# Patient Record
Sex: Female | Born: 1960 | Race: White | Hispanic: No | Marital: Married | Smoking: Former smoker
Health system: Southern US, Community
[De-identification: ages and names within clinical notes are randomized; demographics above are authoritative.]

## PROBLEM LIST (undated history)

## (undated) DIAGNOSIS — J309 Allergic rhinitis, unspecified: Secondary | ICD-10-CM

## (undated) DIAGNOSIS — K219 Gastro-esophageal reflux disease without esophagitis: Secondary | ICD-10-CM

## (undated) DIAGNOSIS — Z9889 Other specified postprocedural states: Secondary | ICD-10-CM

## (undated) DIAGNOSIS — K449 Diaphragmatic hernia without obstruction or gangrene: Secondary | ICD-10-CM

## (undated) DIAGNOSIS — I10 Essential (primary) hypertension: Secondary | ICD-10-CM

## (undated) DIAGNOSIS — N979 Female infertility, unspecified: Secondary | ICD-10-CM

## (undated) DIAGNOSIS — F419 Anxiety disorder, unspecified: Secondary | ICD-10-CM

## (undated) DIAGNOSIS — F32A Depression, unspecified: Secondary | ICD-10-CM

## (undated) DIAGNOSIS — Z85828 Personal history of other malignant neoplasm of skin: Secondary | ICD-10-CM

## (undated) DIAGNOSIS — J302 Other seasonal allergic rhinitis: Secondary | ICD-10-CM

## (undated) DIAGNOSIS — M1990 Unspecified osteoarthritis, unspecified site: Secondary | ICD-10-CM

## (undated) DIAGNOSIS — Z973 Presence of spectacles and contact lenses: Secondary | ICD-10-CM

## (undated) DIAGNOSIS — I493 Ventricular premature depolarization: Secondary | ICD-10-CM

## (undated) DIAGNOSIS — L509 Urticaria, unspecified: Secondary | ICD-10-CM

## (undated) DIAGNOSIS — E785 Hyperlipidemia, unspecified: Secondary | ICD-10-CM

## (undated) DIAGNOSIS — F329 Major depressive disorder, single episode, unspecified: Secondary | ICD-10-CM

## (undated) DIAGNOSIS — G2581 Restless legs syndrome: Secondary | ICD-10-CM

## (undated) DIAGNOSIS — K5909 Other constipation: Secondary | ICD-10-CM

## (undated) DIAGNOSIS — N939 Abnormal uterine and vaginal bleeding, unspecified: Secondary | ICD-10-CM

## (undated) HISTORY — DX: Depression, unspecified: F32.A

## (undated) HISTORY — PX: LUMBAR LAMINECTOMY: SHX95

## (undated) HISTORY — DX: Major depressive disorder, single episode, unspecified: F32.9

## (undated) HISTORY — DX: Restless legs syndrome: G25.81

## (undated) HISTORY — DX: Female infertility, unspecified: N97.9

## (undated) HISTORY — DX: Urticaria, unspecified: L50.9

## (undated) HISTORY — DX: Abnormal uterine and vaginal bleeding, unspecified: N93.9

---

## 1996-08-29 HISTORY — PX: DILATION AND CURETTAGE OF UTERUS: SHX78

## 1999-01-29 HISTORY — PX: TUBAL LIGATION: SHX77

## 1999-09-29 HISTORY — PX: TUBAL LIGATION: SHX77

## 1999-12-30 HISTORY — PX: LUMBAR LAMINECTOMY: SHX95

## 2000-12-29 HISTORY — PX: ENDOMETRIAL ABLATION: SHX621

## 2010-12-29 HISTORY — PX: LAPAROSCOPIC CHOLECYSTECTOMY: SUR755

## 2011-01-01 HISTORY — PX: CHOLECYSTECTOMY: SHX55

## 2014-01-10 LAB — HM COLONOSCOPY

## 2014-12-29 HISTORY — PX: MOHS SURGERY: SUR867

## 2016-08-20 DIAGNOSIS — D249 Benign neoplasm of unspecified breast: Secondary | ICD-10-CM | POA: Insufficient documentation

## 2016-10-29 DIAGNOSIS — M19019 Primary osteoarthritis, unspecified shoulder: Secondary | ICD-10-CM | POA: Insufficient documentation

## 2017-04-27 ENCOUNTER — Emergency Department (HOSPITAL_COMMUNITY)
Admission: EM | Admit: 2017-04-27 | Discharge: 2017-04-27 | Disposition: A | Discharge status: Home / Self Care | Payer: BLUE CROSS/BLUE SHIELD | Attending: Emergency Medicine | Admitting: Emergency Medicine

## 2017-04-27 ENCOUNTER — Emergency Department (HOSPITAL_COMMUNITY): Payer: BLUE CROSS/BLUE SHIELD

## 2017-04-27 ENCOUNTER — Encounter (HOSPITAL_COMMUNITY): Payer: Self-pay | Admitting: Emergency Medicine

## 2017-04-27 DIAGNOSIS — R079 Chest pain, unspecified: Secondary | ICD-10-CM | POA: Diagnosis present

## 2017-04-27 DIAGNOSIS — I700 Atherosclerosis of aorta: Secondary | ICD-10-CM | POA: Diagnosis not present

## 2017-04-27 DIAGNOSIS — I10 Essential (primary) hypertension: Secondary | ICD-10-CM | POA: Insufficient documentation

## 2017-04-27 DIAGNOSIS — R072 Precordial pain: Secondary | ICD-10-CM

## 2017-04-27 DIAGNOSIS — E041 Nontoxic single thyroid nodule: Secondary | ICD-10-CM | POA: Diagnosis not present

## 2017-04-27 DIAGNOSIS — Z7982 Long term (current) use of aspirin: Secondary | ICD-10-CM | POA: Diagnosis not present

## 2017-04-27 DIAGNOSIS — Z79899 Other long term (current) drug therapy: Secondary | ICD-10-CM | POA: Diagnosis not present

## 2017-04-27 DIAGNOSIS — R202 Paresthesia of skin: Secondary | ICD-10-CM

## 2017-04-27 DIAGNOSIS — N840 Polyp of corpus uteri: Secondary | ICD-10-CM

## 2017-04-27 DIAGNOSIS — N63 Unspecified lump in unspecified breast: Secondary | ICD-10-CM | POA: Diagnosis not present

## 2017-04-27 DIAGNOSIS — Z87891 Personal history of nicotine dependence: Secondary | ICD-10-CM | POA: Diagnosis not present

## 2017-04-27 DIAGNOSIS — N6310 Unspecified lump in the right breast, unspecified quadrant: Secondary | ICD-10-CM

## 2017-04-27 HISTORY — DX: Essential (primary) hypertension: I10

## 2017-04-27 HISTORY — DX: Anxiety disorder, unspecified: F41.9

## 2017-04-27 LAB — CBC
HCT: 42.8 % (ref 36.0–46.0)
Hemoglobin: 14.8 g/dL (ref 12.0–15.0)
MCH: 31 pg (ref 26.0–34.0)
MCHC: 34.6 g/dL (ref 30.0–36.0)
MCV: 89.5 fL (ref 78.0–100.0)
Platelets: 224 10*3/uL (ref 150–400)
RBC: 4.78 MIL/uL (ref 3.87–5.11)
RDW: 13.1 % (ref 11.5–15.5)
WBC: 10.2 10*3/uL (ref 4.0–10.5)

## 2017-04-27 LAB — BASIC METABOLIC PANEL
Anion gap: 9 (ref 5–15)
BUN: 12 mg/dL (ref 6–20)
CHLORIDE: 104 mmol/L (ref 101–111)
CO2: 25 mmol/L (ref 22–32)
Calcium: 10.2 mg/dL (ref 8.9–10.3)
Creatinine, Ser: 0.81 mg/dL (ref 0.44–1.00)
GFR calc non Af Amer: >60 mL/min (ref >60)
Glucose, Bld: 114 mg/dL — ABNORMAL HIGH (ref 65–99)
POTASSIUM: 3.8 mmol/L (ref 3.5–5.1)
SODIUM: 138 mmol/L (ref 135–145)

## 2017-04-27 LAB — I-STAT TROPONIN, ED
Troponin i, poc: 0 ng/mL (ref 0.00–0.08)
Troponin i, poc: 0 ng/mL (ref 0.00–0.08)

## 2017-04-27 MED ORDER — IOPAMIDOL (ISOVUE-370) INJECTION 76%
INTRAVENOUS | Status: AC
  Administered 2017-04-27: 100 mL
  Filled 2017-04-27: qty 100

## 2017-04-27 NOTE — ED Provider Notes (Signed)
[facility] DEPT Provider Note   CSN: [id number] Arrival date & time: 04/27/17  0710     History   Chief Complaint Chief Complaint  Patient presents with  . Chest Pain    HPI Alice Hodges is a 56 y.o. female.  Alice Hodges is a 56 y.o. Female who presents to the ED complaining of left-sided chest pain since waking up this morning around 5:50 AM. She doesn't she woke up with her left arm feeling numb, which has since resolved. She reports this happens sometimes the pain on how she sleeps. She reports she also woke up with left-sided chest pain that radiates to her back. She reports currently her pain is a 3 out of 10. She denies associated shortness of breath. She denies associated extremity pain currently. She reports associated history of hyperlipidemia and hyper tension. She is a former smoker. She denies previous MI. She reports taking two 325 mg aspirin without relief. She denies current numbness or tingling. She denies fevers, cough, shortness of breath, palpitations, leg pain, leg swelling, recent long travel, abdominal pain, nausea, vomiting, lightheadedness, dizziness, syncope or rashes.   The history is provided by the patient, medical records and the spouse. No language interpreter was used.  Chest Pain   Associated symptoms include numbness (Resolved.). Pertinent negatives include no abdominal pain, no back pain, no cough, no dizziness, no fever, no headaches, no nausea, no palpitations, no shortness of breath, no vomiting and no weakness.    Past Medical History:  Diagnosis Date  . Anxiety   . High cholesterol   . Hypertension     There are no active problems to display for this patient.   Past Surgical History:  Procedure Laterality Date  . BACK SURGERY    . CHOLECYSTECTOMY    . TUBAL LIGATION      OB History    No data available       Home Medications    Prior to Admission medications   Medication Sig Start Date End Date Taking? Authorizing  Provider  aspirin 325 MG EC tablet Take 650 mg by mouth daily.   Yes Historical Provider, MD  calcium-vitamin D (OSCAL WITH D) 500-200 MG-UNIT tablet Take 1 tablet by mouth daily with breakfast.   Yes Historical Provider, MD  clonazePAM (KLONOPIN) 1 MG tablet Take 1 mg by mouth 2 (two) times daily as needed for anxiety. 01/29/17  Yes Historical Provider, MD  Garlic 5361 MG CAPS Take 1,000 mg by mouth daily.   Yes Historical Provider, MD  hydrochlorothiazide (HYDRODIURIL) 25 MG tablet Take 25 mg by mouth daily. 02/26/17  Yes Historical Provider, MD  ibuprofen (ADVIL,MOTRIN) 200 MG tablet Take 400 mg by mouth every 6 (six) hours as needed (pain).   Yes Historical Provider, MD  KRILL OIL PO Take 1 capsule by mouth daily.   Yes Historical Provider, MD  lisinopril (PRINIVIL,ZESTRIL) 10 MG tablet Take 10 mg by mouth daily. 04/13/17  Yes Historical Provider, MD  Multiple Vitamin (MULTIVITAMIN WITH MINERALS) TABS tablet Take 1 tablet by mouth daily.   Yes Historical Provider, MD  Multiple Vitamins-Minerals (PRESERVISION AREDS 2 PO) Take 1 capsule by mouth daily.   Yes Historical Provider, MD  Red Yeast Rice 600 MG TABS Take 600 mg by mouth daily.   Yes Historical Provider, MD    Family History No family history on file.  Social History Social History  Substance Use Topics  . Smoking status: Former Research scientist (life sciences)  . Smokeless tobacco: Never Used  .  Alcohol use Yes     Allergies   Patient has no known allergies.   Review of Systems Review of Systems  Constitutional: Negative for chills and fever.  HENT: Negative for congestion and sore throat.   Eyes: Negative for visual disturbance.  Respiratory: Negative for cough, shortness of breath and wheezing.   Cardiovascular: Positive for chest pain. Negative for palpitations and leg swelling.  Gastrointestinal: Negative for abdominal pain, diarrhea, nausea and vomiting.  Genitourinary: Negative for dysuria.  Musculoskeletal: Negative for back pain and neck  pain.  Skin: Negative for rash.  Neurological: Positive for numbness (Resolved.). Negative for dizziness, syncope, weakness, light-headedness and headaches.     Physical Exam Updated Vital Signs BP 137/72 (BP Location: Left Arm)   Pulse (!) 54   Temp 98.2 F (36.8 C) (Oral)   Resp 17   Ht 5' 5.5" (1.664 m)   Wt 94.3 kg   LMP 03/21/2017   SpO2 99%   BMI 34.09 kg/m   Physical Exam  Constitutional: She appears well-developed and well-nourished. No distress.  Nontoxic-appearing.  HENT:  Head: Normocephalic and atraumatic.  Mouth/Throat: Oropharynx is clear and moist.  Eyes: Conjunctivae are normal. Pupils are equal, round, and reactive to light. Right eye exhibits no discharge. Left eye exhibits no discharge.  Neck: Normal range of motion. Neck supple. No JVD present.  Cardiovascular: Normal rate, regular rhythm, normal heart sounds and intact distal pulses.  Exam reveals no gallop and no friction rub.   No murmur heard. Bilateral radial pulses are intact.  Pulmonary/Chest: Effort normal and breath sounds normal. No stridor. No respiratory distress. She has no wheezes. She has no rales. She exhibits no tenderness.  Lungs are clear to ascultation bilaterally. Symmetric chest expansion bilaterally. No increased work of breathing. No rales or rhonchi.    Abdominal: Soft. There is no tenderness.  Musculoskeletal: Normal range of motion. She exhibits no edema or tenderness.  No lower extremity edema or tenderness.  Lymphadenopathy:    She has no cervical adenopathy.  Neurological: She is alert. Coordination normal.  Skin: Skin is warm and dry. Capillary refill takes less than 2 seconds. No rash noted. She is not diaphoretic. No erythema. No pallor.  Psychiatric: She has a normal mood and affect. Her behavior is normal.  Nursing note and vitals reviewed.    ED Treatments / Results  Labs (all labs ordered are listed, but only abnormal results are displayed) Labs Reviewed  BASIC  METABOLIC PANEL - Abnormal; Notable for the following:       Result Value   Glucose, Bld 114 (*)    All other components within normal limits  CBC  I-STAT TROPOININ, ED  I-STAT TROPOININ, ED    EKG  EKG Interpretation  Date/Time:  Monday April 27 2017 07:20:20 EDT Ventricular Rate:  68 PR Interval:    QRS Duration: 85 QT Interval:  391 QTC Calculation: 416 R Axis:   -50 Text Interpretation:  Sinus rhythm Inferior infarct, old Anterior infarct, old Confirmed by Zenia Resides  MD, ANTHONY ([id number]) on 04/27/2017 7:32:31 AM       Radiology Dg Chest 2 View  Result Date: 04/27/2017 CLINICAL DATA:  Chest pain. EXAM: CHEST  2 VIEW COMPARISON:  No recent prior FINDINGS: The heart size and mediastinal contours are within normal limits. Both lungs are clear. The visualized skeletal structures are unremarkable. IMPRESSION: No active cardiopulmonary disease. Electronically Signed   By: [place]   On: 04/27/2017 07:39   Ct Angio Chest/abd/pel  For Dissection W And/or W/wo  Result Date: 04/27/2017 CLINICAL DATA:  56 year old presenting with acute onset of chest pain radiating to the back associated with left upper extremity numbness that began at approximately 6 o'clock a.m. this morning. EXAM: CT ANGIOGRAPHY CHEST, ABDOMEN AND PELVIS TECHNIQUE: Multidetector CT imaging was initially performed through the chest prior to IV contrast administration. Subsequently, multidetector CT imaging through the chest, abdomen and pelvis was performed using the standard protocol during bolus administration of intravenous contrast. Multiplanar reconstructed images and MIPs were obtained and reviewed to evaluate the vascular anatomy. CONTRAST:  100 mL Isovue 370 IV. COMPARISON:  No prior CT.  Chest x-ray earlier today is correlated. FINDINGS: CTA CHEST FINDINGS Cardiovascular: Unenhanced images demonstrate no evidence of mural hematoma. Enhanced images demonstrate no evidence of thoracic aortic dissection. Minimal to  mild thoracic aortic atherosclerosis is present without evidence of aneurysm. Proximal great vessels widely patent. Heart mildly enlarged with left ventricular enlargement. No pericardial effusion. No visible coronary atherosclerosis. Mediastinum/Nodes: Scattered normal sized mediastinal and bilateral hilar lymph nodes without pathologic lymphadenopathy. No pathologic axillary lymphadenopathy. Normal appearing decompressed esophagus. Small hiatal hernia. Small nodules in the right lobe of the thyroid gland, the largest approximately 9 mm. Lungs/Pleura: Scarring in the lingula. Lungs otherwise clear. No confluent airspace consolidation. No pulmonary parenchymal nodules or masses. No evidence of interstitial disease. No pleural effusions. Central airways patent without significant bronchial wall thickening. Musculoskeletal: Minimal mid and lower thoracic spondylosis. No acute or significant osseous abnormality. Other: Asymmetric fibroglandular tissue versus mass involving the upper outer quadrant of the right breast. Review of the MIP images confirms the above findings. CTA ABDOMEN AND PELVIS FINDINGS VASCULAR Aorta: Mild atherosclerosis without evidence of aneurysm or dissection. Celiac: Widely patent. SMA: Widely patent. Renals: Single right renal artery and 2 left renal arteries, all widely patent. IMA: Widely patent. Inflow: Atherosclerosis involving the common iliac arteries, external iliac arteries and internal iliac arteries without evidence of hemodynamically significant stenosis or aneurysm. Veins: Exam was performed for visualization of the arterial system. The veins are not optimally opacified, but there is no asymmetric enlargement of the femoral veins were iliac veins to suggest thrombus. The renal veins are well opacified and are widely patent. Review of the MIP images confirms the above findings. NON-VASCULAR Hepatobiliary: Simple cysts involving the left lobe of the liver. No significant focal hepatic  abnormality allowing for the early portal venous phase of enhancement. Gallbladder surgically absent. No biliary ductal dilation. Pancreas: Normal in appearance without evidence of mass, ductal dilation, or inflammation. Spleen: Heterogeneous enhancement due to the arterial/early portal venous phase of enhancement. No focal abnormalities are suspected. Adrenals/Urinary Tract: Normal appearing adrenal glands. Approximate 1.9 cm simple cyst arising from the mid right kidney. No significant abnormalities involving either kidney. No hydronephrosis. No urinary tract calculi. Urinary bladder normal in appearance. Stomach/Bowel: Small hiatal hernia as mentioned above. Stomach decompressed and otherwise unremarkable. Normal-appearing small bowel. Opaque ingested capsule involving the distal small bowel in the pelvis. Sigmoid colon tortuous. Scattered colonic diverticulum without evidence of acute diverticulitis. Ingested opaque tablets in the proximal and mid transverse colon. Moderate colonic stool burden. Normal appendix in the right upper pelvis. Lymphatic: No pathologic lymphadenopathy. Reproductive: Enhancing approximate 8 mm nodule arising from the endometrium. No myometrial masses. Approximate 2 cm cyst arising from the right ovary. Note significant adnexal masses. No free pelvic fluid. Other: None. Musculoskeletal: Degenerative disc disease and spondylosis at L4-5 and L5-S1. Circumferential disc bulge at L5-S1 with possible right paracentral and  foraminal disc extrusion at this level. Review of the MIP images confirms the above findings. IMPRESSION: 1. No evidence of aortic dissection or aneurysm. Mild atherosclerosis involving the thoracic and abdominal aorta. (Imaging finding of potential clinical significance). 2. Enhancing approximate 8 mm mass arising from the endometrium. An endometrial polyp or endometrial malignancy can have this CT appearance. Gynecologic consultation for endoscopic evaluation and possible  endometrial biopsy is recommended. 3. Asymmetric fibroglandular tissue versus mass involving the upper outer quadrant of the right breast. Please correlate with mammography. There are no prior mammograms in the [facility] for comparison. 4.  No acute cardiopulmonary disease.  Scarring in the lingula. 5. Right lobe thyroid nodules, the largest measuring approximately 9 mm. No further imaging evaluation is needed. This follows ACR consensus guidelines: Managing Incidental Thyroid Nodules Detected on Imaging: White Paper of the ACR Incidental Thyroid Findings Committee. J Am Coll Radiol 2015; 12:143-150. 6. Small hiatal hernia. 7. Scattered colonic diverticula without evidence of acute diverticulitis. 8. Possible L5-S1 right paracentral and foraminal disc extrusion. Does the patient have right lower extremity radicular symptoms? Electronically Signed   By: Evangeline Dakin M.D.   On: 04/27/2017 10:29    Procedures Procedures (including critical care time)  Medications Ordered in ED Medications  iopamidol (ISOVUE-370) 76 % injection (100 mLs  Contrast Given 04/27/17 0943)     Initial Impression / Assessment and Plan / ED Course  I have reviewed the triage vital signs and the nursing notes.  Pertinent labs & imaging results that were available during my care of the patient were reviewed by me and considered in my medical decision making (see chart for details).    This is a 56 y.o. Female who presents to the ED complaining of left-sided chest pain since waking up this morning around 5:50 AM. She doesn't she woke up with her left arm feeling numb, which has since resolved. She reports this happens sometimes the pain on how she sleeps. She reports she also woke up with left-sided chest pain that radiates to her back. She reports currently her pain is a 3 out of 10. She denies associated shortness of breath. She denies associated extremity pain currently. She reports associated history of  hyperlipidemia and hyper tension. She is a former smoker. She denies previous MI. She reports taking two 325 mg aspirin without relief. She denies current numbness or tingling. On exam the patient is afebrile nontoxic appearing. Lungs clear to auscultation bilaterally. Chest wall is nontender to palpation. EKG shows no evidence of STEMI. There is evidence of possible old inferior MI. Chest x-ray is unremarkable. Initial troponin is not elevated. BMP and CBC are unremarkable.  Due to this patient's complaint of chest pain and arm numbness will obtain CT dissection study to rule out dissection. CT dissection study shows no evidence of aortic dissection or aneurysm. It does show mild atherosclerosis involving the thoracic and abdominal aorta. I advised this finding to the patient.  Repeat troponin is not elevated. On repeat exam patient reports feeling better and has some reproducible pain on palpation of her chest wall.  I discussed the incidental findings on her CT scan including the aortic atherosclerosis. I advise of the thyroid nodule, endometrial polyp and encouraged follow-up with OB, hiatal hernia, and possible L5-S1 right paracentral and foraminal disc extrusion. She denies any current back pain or radicular symptoms.  Also discussed the fibroglandular tissue in the right breast. Patient reports she has had previous mammograms with this finding at an  outside facility. She is artery and evaluated for this previously. Overall workup is reassuring here today. I did encourage her to follow-up with cardiology for possible stress test. Also encouraged follow-up by primary care for hypertension and thyroid nodule. Also gynecology for her endometrial polyp. Discussed strict and specific return precautions. I advised the patient to follow-up with their primary care provider this week. I advised the patient to return to the emergency department with new or worsening symptoms or new concerns. The patient  verbalized understanding and agreement with plan.    This patient was discussed with and evaluated by Dr. Zenia Resides who agrees with assessment and plan.   Final Clinical Impressions(s) / ED Diagnoses   Final diagnoses:  Precordial pain  Paresthesias  Aortic atherosclerosis (HCC)  Endometrial polyp  Thyroid nodule  Mass of right breast  Essential hypertension    New Prescriptions New Prescriptions   No medications on file     Waynetta Pean, PA-C 04/27/17 1232

## 2017-04-27 NOTE — ED Provider Notes (Signed)
Medical screening examination/treatment/procedure(s) were conducted as a shared visit with non-physician practitioner(s) and myself.  I personally evaluated the patient during the encounter.   EKG Interpretation  Date/Time:  Monday April 27 2017 07:20:20 EDT Ventricular Rate:  68 PR Interval:    QRS Duration: 85 QT Interval:  391 QTC Calculation: 416 R Axis:   -50 Text Interpretation:  Sinus rhythm Inferior infarct, old Anterior infarct, old Confirmed by Gwenevere Goga  MD, Karry Causer ([id number]) on 04/27/2017 7:32:31 AM     Patient here complaining of persistent left-sided chest pain with left arm numbness times several hours. Pain is sharp and located on her left parasternal area. States that she has had left arm numbness before in the past when she has slept funny. No anginal type qualities. On exam she has has reducible tenderness at the left parasternal region. CT of the chest negative for PE. Patient to have a delta troponin and then will be discharged home with follow-up for her chest CT results.   Lacretia Leigh, MD 04/27/17 1106

## 2017-04-27 NOTE — ED Triage Notes (Signed)
Patient states that when she woke up she had left arm numbness which happens frequently but then started having chest tightness and pressure that radiates to her back.  Patient states numbness in arm has gone away but chest pressure hasnt. Patient took 2 aspirin 325mg  with no relief.

## 2017-04-27 NOTE — ED Notes (Signed)
Patient understood AVS instructions.  She has no questions.

## 2017-04-30 ENCOUNTER — Encounter: Payer: Self-pay | Admitting: Cardiology

## 2017-04-30 ENCOUNTER — Ambulatory Visit (INDEPENDENT_AMBULATORY_CARE_PROVIDER_SITE_OTHER): Payer: BLUE CROSS/BLUE SHIELD | Admitting: Cardiology

## 2017-04-30 DIAGNOSIS — R9431 Abnormal electrocardiogram [ECG] [EKG]: Secondary | ICD-10-CM

## 2017-04-30 DIAGNOSIS — R079 Chest pain, unspecified: Secondary | ICD-10-CM | POA: Diagnosis not present

## 2017-04-30 DIAGNOSIS — E785 Hyperlipidemia, unspecified: Secondary | ICD-10-CM | POA: Diagnosis not present

## 2017-04-30 DIAGNOSIS — I10 Essential (primary) hypertension: Secondary | ICD-10-CM | POA: Diagnosis not present

## 2017-04-30 LAB — COMPREHENSIVE METABOLIC PANEL
ALBUMIN: 3.9 g/dL (ref 3.6–5.1)
ALT: 24 U/L (ref 6–29)
AST: 22 U/L (ref 10–35)
Alkaline Phosphatase: 59 U/L (ref 33–130)
BILIRUBIN TOTAL: 0.5 mg/dL (ref 0.2–1.2)
BUN: 16 mg/dL (ref 7–25)
CHLORIDE: 106 mmol/L (ref 98–110)
CO2: 24 mmol/L (ref 20–31)
Calcium: 10 mg/dL (ref 8.6–10.4)
Creat: 0.84 mg/dL (ref 0.50–1.05)
Glucose, Bld: 104 mg/dL — ABNORMAL HIGH (ref 65–99)
POTASSIUM: 4.2 mmol/L (ref 3.5–5.3)
Sodium: 140 mmol/L (ref 135–146)
Total Protein: 6.5 g/dL (ref 6.1–8.1)

## 2017-04-30 LAB — LIPID PANEL
CHOL/HDL RATIO: 5.1 ratio — AB (ref <5.0)
CHOLESTEROL: 200 mg/dL — AB (ref <200)
HDL: 39 mg/dL — ABNORMAL LOW (ref >50)
LDL Cholesterol: 123 mg/dL — ABNORMAL HIGH (ref <100)
TRIGLYCERIDES: 188 mg/dL — AB (ref <150)
VLDL: 38 mg/dL — AB (ref <30)

## 2017-04-30 NOTE — Progress Notes (Addendum)
PCP: Patient, No Pcp Per; Julaine Hua, MD  - [facility] - Dr. Duane Boston is retiring  Clinic Note: Chief Complaint  Patient presents with  . Hospitalization Follow-up    ER visit  . Chest Pain    HPI: Alice Hodges is a 56 y.o. female with a PMH notable for hypertension, hyperlipidemia as well as anxiety/depression below who presents today for recent ER visit for Chest Pain. Alice Hodges is being seen today for the evaluation of chest pain at the request of Lacretia Leigh, M.D. (EDP)   Alice Hodges was last seen on 03/16/2017 by her previous PCP who indicated that Alice Hodges has recently passed away in his memory difficult thing for her to deal with. She is now moving to [place] where there are more job opportunities and she can be closer to her (Alice) Hodges.  Recent Hospitalizations: ER evaluation on 04/27/2017 - she was seen by Dr. Lacretia Leigh. She was complaining of left-sided chest pain and left arm numbness for several hours. Described pain as sharp left parasternal area. Ruled out for MI. Chest CTA negative - but did show evidence of aortic atherosclerosis.Marland Kitchen -- She woke up with this left-sided chest pain but denied any associated dyspnea. No other symptoms of palpitations dyspnea  Studies Personally Reviewed - if available, images/films reviewed: From Epic Chart or Care Everywhere   Chest CTA or April 27 2017: No aortic dissection. Minimal to mild thoracic aortic atherosclerosis. No visible coronary atherosclerosis.(Other findings included 23mm mass in the endometrium, asymmetric fibroglandular tissue in the right breast, scarring in the lingula, right lobe thyroid nodules, scattered colonic diverticula without diverticulitis)  Interval History: Alice Hodges presents today for first cardiology visit here. She seems to be quite stressed and concerned about her recent episode of chest pain that woke her up from sleep and lasted several hours. He was  having his pressure sensation like an elephant sitting on her chest. It was associated with some shortness of breath. She has been trying to walk out on the [place] but has been scared to do so a decent pace. She has walked a little bit and stopped. In doing this she has not had any further pain.  She denies a PND, orthopnea or edema just has exertional dyspnea and exercise intolerance.  She describes an episode 2004 when she had a stress test done for having some chest discomfort and dizziness. Then in 2014 she again had some dizziness and chest discomfort and felt clammy etc. He went to the ER but no further workup was done.  Occasional positional dizzy spell ~1/ 2 month.    Has had ~ PVCs noted since her 38s - only notes when very relaxed (nothing fast or irregular).   No further chest pain or shortness of breath with rest or exertion.  No PND, orthopnea - occasional mild ankle edema (better with wgt loss) No more episodes of lightheadedness, dizziness, weakness or syncope/near syncope. No TIA/amaurosis fugax symptoms. No melena, hematochezia, hematuria, or epstaxis. No claudication.  She had been gaining quite a bit overweight up until about November 2017.  At that point, she made a decision to be more attention to her weight, she started becoming more active and eating better and has lost about 20 pounds.  ROS: A comprehensive was performed. Review of Systems  Constitutional: Negative for chills, fever and malaise/fatigue (much better this year - socially moving forward).  HENT: Negative for congestion and nosebleeds.   Respiratory: Negative for cough,  shortness of breath and wheezing.   Gastrointestinal: Negative for blood in stool and melena.  Genitourinary: Negative for hematuria.  Musculoskeletal: Positive for joint pain (L hip pain - bone spur; L Knee).  Neurological: Negative for dizziness.  Endo/Heme/Allergies: Negative for environmental allergies (better in [place] than in [place]  Psychiatric/Behavioral: Positive for depression (doing better). Negative for memory loss. The patient is not nervous/anxious and does not have insomnia.   All other systems reviewed and are negative.  I have reviewed and (if needed) personally updated the patient's problem list, medications, allergies, past medical and surgical history, social and family history.   Past Medical History:  Diagnosis Date  . Anxiety   . High cholesterol   . Hypertension   . Restless leg syndrome     Past Surgical History:  Procedure Laterality Date  . BACK SURGERY  03/2000   And then again in December 2001  . CHOLECYSTECTOMY  12/2010  . DILATION AND CURETTAGE OF UTERUS  08/1996  . TUBAL LIGATION  09/1999    Current Meds  Medication Sig  . calcium-vitamin D (OSCAL WITH D) 500-200 MG-UNIT tablet Take 1 tablet by mouth daily with breakfast.  . clonazePAM (KLONOPIN) 1 MG tablet Take 1 mg by mouth 2 (two) times daily as needed for anxiety.  . Garlic 0300 MG CAPS Take 1,000 mg by mouth daily.  . hydrochlorothiazide (HYDRODIURIL) 25 MG tablet Take 25 mg by mouth daily.  Marland Kitchen ibuprofen (ADVIL,MOTRIN) 200 MG tablet Take 400 mg by mouth every 6 (six) hours as needed (pain).  Marland Kitchen KRILL OIL PO Take 1 capsule by mouth daily. 300 MG  . lisinopril (PRINIVIL,ZESTRIL) 10 MG tablet Take 10 mg by mouth daily.  . Melatonin 10 MG CAPS Take 10 mg by mouth daily as needed.  . Multiple Vitamin (MULTIVITAMIN WITH MINERALS) TABS tablet Take 1 tablet by mouth daily.  . Multiple Vitamins-Minerals (PRESERVISION AREDS 2 PO) Take 1 capsule by mouth daily.  Marland Kitchen omeprazole (PRILOSEC) 20 MG capsule Take 20 mg by mouth every other day.  . Red Yeast Rice 600 MG TABS Take 600 mg by mouth daily.    No Known Allergies  Social History   Social History  . Marital status: Married    Spouse name: N/A  . Number of children: N/A  . Years of education: N/A   Social History Main Topics  . Smoking status: Former Research scientist (life sciences)  . Smokeless  tobacco: Never Used  . Alcohol use Yes  . Drug use: Unknown  . Sexual activity: Not Asked   Other Topics Concern  . None   Social History Narrative   She and her husband Alice Hodges) recently moved from [facility] [place] for better job opportunities. 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 She is now ready to set herself back up working.      - second marriage. No children. Her current husband Alice Hodges) has children and grandchildren.Granddaughter just diagnosed with cancer   She is starting to try to go out and exercise with her husband walking on it routinely.  -  family history includes COPD in her father; Coronary artery disease in her paternal grandmother; Hypertension in her father; Lung cancer in her paternal uncle; Polycythemia in her sister; Rheumatic fever (age of onset: 19) in her Hodges.  Wt Readings from Last 3 Encounters:  04/30/17 210 lb (95.3 kg)  04/27/17 208 lb (94.3 kg)    PHYSICAL EXAM BP 121/79   Pulse 61   Ht 5' 5.5" (1.664 m)   Wt 210 lb (95.3 kg)   BMI 34.41 kg/m  General appearance: alert, cooperative, appears stated age, no distress and Mildly obese HEENT: [place]/AT, EOMI, MMM, anicteric sclera Neck: no adenopathy, no carotid bruit and no JVD Lungs: clear to auscultation bilaterally, normal percussion bilaterally and non-labored Heart: regular rate and rhythm, S1 & S2 normal, no murmur, click, rub or gallop; nondisplaced PMI Abdomen: soft, non-tender; bowel sounds normal; no masses,  no organomegaly; no HJR Extremities: extremities normal, atraumatic, no cyanosis. edema -trivial Pulses: 2+ and symmetric;  Skin: mobility and turgor normal, no edema, no evidence of bleeding or bruising, temperature normal and texture normal  Neurologic: Mental status: A&O x3; thought content appropriate; pleasant mood and affect Cranial nerves:  normal (II-XII grossly intact)    Adult ECG Report -- from ER EKG  Rate: 68 ;  Rhythm: normal sinus rhythm and Poor R-wave progression in precordial leads suggest possible anterior infarct, age indeterminant; possible inferior Q waves suggestive of inferior infarct, age undetermined. Otherwise normal axis, intervals and durations.;   Narrative Interpretation: 2 sequential EKGs with this result. No prior EKG compared to   Other studies Reviewed: Additional studies/ records that were reviewed today include:  Recent Labs:  Labs most recently from July 2017 showed total cholesterol 204, HDL 38, LDL 101, triglycerides 323. TSH 0.7. BUN 16, creatinine 1.0. Glucose 91. LFTs normal. -- From 03/16/2017: Sodium 142, potassium 4.3, chloride 98, bicarbonate 21, BUN 15, creatinine 0.79, glucose 74, calcium 10.4. ALT/AST 30/23. Alkaline phosphatase 75. Albumin 4.5. Total bilirubin 0.4. CBC: W 12.5, H/H 15.6/46.9, platelet 287  ASSESSMENT / PLAN: Problem List Items Addressed This Visit    Abnormal finding on EKG (Chronic)    Her EKG is very interesting with looks like both inferior and anterior septal Q waves. There may be subtle R waves in all these leads, but is a very imaging EKG I would like to exclude any prior infarct. Plan: 2-D echocardiogram      Relevant Orders   ECHOCARDIOGRAM COMPLETE   ECHOCARDIOGRAM STRESS TEST   Chest pain with moderate risk for cardiac etiology    The episode she had that made her go to the emergency room was concerning for possible anginal symptoms based on the description. There were some typical and other atypical features. She does have risk factors of obesity, hypertension and dyslipidemia. As much for her peace of mind sake with her trying to exercise, I would like to exclude ischemic chest pain. At least moderate risk based on risk factors. Plan: Stress Echocardiogram      Relevant Orders   ECHOCARDIOGRAM COMPLETE   ECHOCARDIOGRAM STRESS TEST   Comprehensive  metabolic panel (Completed)   Dyslipidemia (Chronic)    Concern for possible CAD, we need to understand her risk factors.  Plan: Check CMP and lipid panel.      Relevant Orders   Lipid panel (Completed)   Comprehensive metabolic panel (Completed)   Essential hypertension (Chronic)    Her blood pressures pretty good today on combination of HCTZ and lisinopril. If she has more palpitations, we may need to consider beta blocker, however for now I would simply maintain current management.      Relevant Orders   Comprehensive metabolic panel (Completed)      Current medicines are reviewed at length with the patient  today. (+/- concerns) None The following changes have been made: None  Patient Instructions  Schedule at [facility]  [address] Do first  Your physician has requested that you have an echocardiogram. Echocardiography is a painless test that uses sound waves to create images of your heart. It provides your doctor with information about the size and shape of your heart and how well your heart's chambers and valves are working. This procedure takes approximately one hour. There are no restrictions for this procedure. Do second Your physician has requested that you have a stress echocardiogram. For further information please visit [email or web address]. Please follow instruction sheet as given.   Labs  CMP Lipids Do not eat or drink the morning of the test   Your physician recommends that you schedule a follow-up appointment in 1 months with DR HARDING.    Studies Ordered:   Orders Placed This Encounter  Procedures  . Lipid panel  . Comprehensive metabolic panel  . ECHOCARDIOGRAM COMPLETE  . ECHOCARDIOGRAM STRESS TEST      Glenetta Hew, M.D., M.S. Interventional Cardiologist   Pager # [phone] Phone # [phone] [address] [place], [place] [address]

## 2017-04-30 NOTE — Patient Instructions (Addendum)
Schedule at [facility]  [address] Do first  Your physician has requested that you have an echocardiogram. Echocardiography is a painless test that uses sound waves to create images of your heart. It provides your doctor with information about the size and shape of your heart and how well your heart's chambers and valves are working. This procedure takes approximately one hour. There are no restrictions for this procedure. Do second Your physician has requested that you have a stress echocardiogram. For further information please visit [email or web address]. Please follow instruction sheet as given.   Labs  CMP Lipids Do not eat or drink the morning of the test   Your physician recommends that you schedule a follow-up appointment in 1 months with DR HARDING.

## 2017-05-02 ENCOUNTER — Encounter: Payer: Self-pay | Admitting: Cardiology

## 2017-05-02 NOTE — Assessment & Plan Note (Addendum)
Her blood pressures pretty good today on combination of HCTZ and lisinopril. If she has more palpitations, we may need to consider beta blocker, however for now I would simply maintain current management.

## 2017-05-02 NOTE — Assessment & Plan Note (Signed)
Her EKG is very interesting with looks like both inferior and anterior septal Q waves. There may be subtle R waves in all these leads, but is a very imaging EKG I would like to exclude any prior infarct. Plan: 2-D echocardiogram

## 2017-05-02 NOTE — Assessment & Plan Note (Signed)
Concern for possible CAD, we need to understand her risk factors.  Plan: Check CMP and lipid panel.

## 2017-05-02 NOTE — Assessment & Plan Note (Signed)
The episode she had that made her go to the emergency room was concerning for possible anginal symptoms based on the description. There were some typical and other atypical features. She does have risk factors of obesity, hypertension and dyslipidemia. As much for her peace of mind sake with her trying to exercise, I would like to exclude ischemic chest pain. At least moderate risk based on risk factors. Plan: Stress Echocardiogram

## 2017-05-27 ENCOUNTER — Other Ambulatory Visit (HOSPITAL_COMMUNITY): Payer: BLUE CROSS/BLUE SHIELD

## 2017-06-05 ENCOUNTER — Ambulatory Visit: Payer: BLUE CROSS/BLUE SHIELD | Admitting: Cardiology

## 2017-06-05 ENCOUNTER — Encounter: Payer: Self-pay | Admitting: Cardiology

## 2017-07-28 LAB — TSH: TSH: 0.94 (ref 0.41–5.90)

## 2017-07-28 LAB — HM PAP SMEAR: HM Pap smear: NEGATIVE

## 2017-08-27 LAB — HM MAMMOGRAPHY

## 2017-11-06 ENCOUNTER — Encounter: Payer: Self-pay | Admitting: Family Medicine

## 2017-11-06 ENCOUNTER — Ambulatory Visit: Payer: BLUE CROSS/BLUE SHIELD | Admitting: Family Medicine

## 2017-11-06 VITALS — BP 110/84 | HR 72 | Ht 64.5 in | Wt 200.0 lb

## 2017-11-06 DIAGNOSIS — E669 Obesity, unspecified: Secondary | ICD-10-CM | POA: Diagnosis not present

## 2017-11-06 DIAGNOSIS — Z1159 Encounter for screening for other viral diseases: Secondary | ICD-10-CM

## 2017-11-06 DIAGNOSIS — Z23 Encounter for immunization: Secondary | ICD-10-CM | POA: Diagnosis not present

## 2017-11-06 DIAGNOSIS — Z0001 Encounter for general adult medical examination with abnormal findings: Secondary | ICD-10-CM | POA: Diagnosis not present

## 2017-11-06 DIAGNOSIS — G2581 Restless legs syndrome: Secondary | ICD-10-CM | POA: Insufficient documentation

## 2017-11-06 DIAGNOSIS — R739 Hyperglycemia, unspecified: Secondary | ICD-10-CM | POA: Diagnosis not present

## 2017-11-06 NOTE — Progress Notes (Signed)
Subjective:  Devika Dragovich is a 56 y.o. female who presents today for her annual comprehensive physical exam And to establish care  HPI:  Eternity Dexter has no acute complaints today.   Lifestyle Diet: Tries to eat a healthy diet.  Plenty of lean meats.  Plenty of fruits and vegetables.  She has been actively trying to lose weight and has noticed that she has lost more weight since starting hormone replacement. Exercise: Regularly exercises. Walks regularly.   Depression screen PHQ 2/9 11/06/2017  Decreased Interest 0  Down, Depressed, Hopeless 0  PHQ - 2 Score 0   Health Maintenance Due  Topic Date Due  . Hepatitis C Screening  11-May-1961  . HIV Screening  04/06/1976  . INFLUENZA VACCINE  07/29/2017     ROS: A 14 point review of systems was performed and was negative  PMH:  The following were reviewed and entered/updated in epic: Past Medical History:  Diagnosis Date  . Anxiety   . High cholesterol   . Hypertension   . Restless leg syndrome    Patient Active Problem List   Diagnosis Date Noted  . Restless leg syndrome 11/06/2017  . Chest pain with moderate risk for cardiac etiology 04/30/2017  . Abnormal finding on EKG 04/30/2017  . Dyslipidemia 04/30/2017  . Essential hypertension 04/30/2017   Past Surgical History:  Procedure Laterality Date  . BACK SURGERY  03/2000   And then again in December 2001  . BREAST BIOPSY N/A   . CHOLECYSTECTOMY  12/2010  . DILATION AND CURETTAGE OF UTERUS  08/1996  . TUBAL LIGATION  09/1999    Family History  Problem Relation Age of Onset  . Rheumatic fever Mother 27       Rheumatic heart disease - she died in childbirth;  . Early death Mother   . Hearing loss Mother   . Heart disease Mother   . Heart attack Mother   . Hypertension Father   . COPD Father        Also paternal uncle  . Alcohol abuse Father   . Kidney disease Father   . Polycythemia Sister   . Depression Sister   . Coronary artery disease Paternal  Grandmother   . Heart attack Paternal Grandmother   . Heart disease Paternal Grandmother   . Depression Sister   . Lung cancer Paternal Uncle        As well as 3 maternal aunts  . Alcohol abuse Maternal Grandfather   . COPD Maternal Grandfather   . Diabetes Maternal Grandfather   . Stroke Maternal Grandfather   . Alcohol abuse Paternal Grandfather   . Mental illness Paternal Grandfather     Medications- reviewed and updated Current Outpatient Medications  Medication Sig Dispense Refill  . clonazePAM (KLONOPIN) 1 MG tablet Take 1 mg by mouth 2 (two) times daily as needed for anxiety.    . clonazePAM (KLONOPIN) 1 MG tablet TAKE 1 TABLET TWICE DAILY AS NEEDED FOR ANXIETY.    Marland Kitchen estradiol (ESTRACE) 1 MG tablet Take by mouth.    . Garlic 7829 MG CAPS Take 1,000 mg by mouth daily.    . hydrochlorothiazide (HYDRODIURIL) 25 MG tablet TAKE 1 TABLET BY MOUTH EVERY DAY **PT MUST MAKE APPT**    . ibuprofen (ADVIL,MOTRIN) 200 MG tablet Take 400 mg by mouth every 6 (six) hours as needed (pain).    Marland Kitchen KRILL OIL PO Take 1 capsule by mouth daily. 300 MG    . lisinopril (PRINIVIL,ZESTRIL) 10  MG tablet Take 10 mg by mouth daily.    Marland Kitchen loratadine (CLARITIN) 10 MG tablet Take 10 mg daily by mouth.    . medroxyPROGESTERone (PROVERA) 2.5 MG tablet     . Melatonin 10 MG CAPS Take 10 mg by mouth daily as needed.    . Multiple Vitamin (MULTIVITAMIN WITH MINERALS) TABS tablet Take 1 tablet by mouth daily.    . Multiple Vitamins-Minerals (PRESERVISION AREDS 2 PO) Take 1 capsule by mouth daily.    Marland Kitchen omeprazole (PRILOSEC) 20 MG capsule Take 20 mg by mouth every other day.    . Red Yeast Rice 600 MG TABS Take 600 mg by mouth daily.     No current facility-administered medications for this visit.     Allergies-reviewed and updated Allergies  Allergen Reactions  . Tape Rash    Social History   Socioeconomic History  . Marital status: Married    Spouse name: None  . Number of children: None  . Years of  education: None  . Highest education level: Bachelor's degree (e.g., BA, AB, BS)  Social Needs  . Financial resource strain: None  . Food insecurity - worry: None  . Food insecurity - inability: None  . Transportation needs - medical: None  . Transportation needs - non-medical: None  Occupational History  . Occupation: Self Employed  Tobacco Use  . Smoking status: Former Research scientist (life sciences)  . Smokeless tobacco: Never Used  Substance and Sexual Activity  . Alcohol use: No    Frequency: Never  . Drug use: No  . Sexual activity: Yes  Other Topics Concern  . None  Social History Narrative   She and her husband Clair Gulling) recently moved from [facility] down to [place] for better job opportunities. 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 She is now ready to set herself back up working.      - second marriage. No children. Her current husband Clair Gulling) has children and grandchildren.Granddaughter just diagnosed with cancer   She is starting to try to go out and exercise with her husband walking on it routinely.    Objective:  Physical Exam: BP 110/84   Pulse 72   Ht 5' 4.5" (1.638 m)   Wt 200 lb (90.7 kg)   LMP 10/17/2017 (Exact Date)   SpO2 98%   BMI 33.80 kg/m   Body mass index is 33.8 kg/m. Gen: NAD, resting comfortably HEENT: TMs normal bilaterally. OP clear. No thyromegaly noted.  CV: RRR with no murmurs appreciated Pulm: NWOB, CTAB with no crackles, wheezes, or rhonchi GI: Normal bowel sounds present. Soft, Nontender, Nondistended. MSK: no edema, cyanosis, or clubbing noted Skin: warm, dry Neuro: CN2-12 grossly intact. Strength 5/5 in upper and lower extremities. Reflexes symmetric and intact bilaterally.  Psych: Normal affect and thought content  Assessment/Plan:  Preventative Healthcare: Flu shot given today.  Check lipid panel, A1c, and basic labs.   Patient is up-to-date on her mammogram, Pap smear, and colonoscopy.  Referral to nutrition for weight management.  Obesity: Discussed lifestyle interventions today.  Patient is actively trying to lose weight.  She is not interested in pharmacotherapy at this point.  She has been on phentermine in the past which she was not able to tolerate due to palpitations.  Referral to nutrition was placed today.  Patient Counseling:  -Nutrition: Stressed importance of moderation in sodium/caffeine  intake, saturated fat and cholesterol, caloric balance, sufficient intake of fresh fruits, vegetables, and fiber.  -Stressed the importance of regular exercise.   -Substance Abuse: Discussed cessation/primary prevention of tobacco, alcohol, or other drug use; driving or other dangerous activities under the influence; availability of treatment for abuse.   -Injury prevention: Discussed safety belts, safety helmets, smoke detector, smoking near bedding or upholstery.   -Sexuality: Discussed sexually transmitted diseases, partner selection, use of condoms, avoidance of unintended pregnancy and contraceptive alternatives.   -Dental health: Discussed importance of regular tooth brushing, flossing, and dental visits.  -Health maintenance and immunizations reviewed. Please refer to Health maintenance section.  Return to care in 1 year for next preventative visit.   Algis Greenhouse. Jerline Pain, MD 11/06/2017 5:17 PM

## 2017-11-06 NOTE — Patient Instructions (Signed)
Please schedule an appointment with Aldona Bar.  I will plan on seeing you in 1 year.  Take care,  Dr Jerline Pain   Preventive Care 40-64 Years, Female Preventive care refers to lifestyle choices and visits with your health care provider that can promote health and wellness. What does preventive care include?  A yearly physical exam. This is also called an annual well check.  Dental exams once or twice a year.  Routine eye exams. Ask your health care provider how often you should have your eyes checked.  Personal lifestyle choices, including: ? Daily care of your teeth and gums. ? Regular physical activity. ? Eating a healthy diet. ? Avoiding tobacco and drug use. ? Limiting alcohol use. ? Practicing safe sex. ? Taking low-dose aspirin daily starting at age 32. ? Taking vitamin and mineral supplements as recommended by your health care provider. What happens during an annual well check? The services and screenings done by your health care provider during your annual well check will depend on your age, overall health, lifestyle risk factors, and family history of disease. Counseling Your health care provider may ask you questions about your:  Alcohol use.  Tobacco use.  Drug use.  Emotional well-being.  Home and relationship well-being.  Sexual activity.  Eating habits.  Work and work Statistician.  Method of birth control.  Menstrual cycle.  Pregnancy history.  Screening You may have the following tests or measurements:  Height, weight, and BMI.  Blood pressure.  Lipid and cholesterol levels. These may be checked every 5 years, or more frequently if you are over 51 years old.  Skin check.  Lung cancer screening. You may have this screening every year starting at age 59 if you have a 30-pack-year history of smoking and currently smoke or have quit within the past 15 years.  Fecal occult blood test (FOBT) of the stool. You may have this test every year  starting at age 38.  Flexible sigmoidoscopy or colonoscopy. You may have a sigmoidoscopy every 5 years or a colonoscopy every 10 years starting at age 36.  Hepatitis C blood test.  Hepatitis B blood test.  Sexually transmitted disease (STD) testing.  Diabetes screening. This is done by checking your blood sugar (glucose) after you have not eaten for a while (fasting). You may have this done every 1-3 years.  Mammogram. This may be done every 1-2 years. Talk to your health care provider about when you should start having regular mammograms. This may depend on whether you have a family history of breast cancer.  BRCA-related cancer screening. This may be done if you have a family history of breast, ovarian, tubal, or peritoneal cancers.  Pelvic exam and Pap test. This may be done every 3 years starting at age 83. Starting at age 36, this may be done every 5 years if you have a Pap test in combination with an HPV test.  Bone density scan. This is done to screen for osteoporosis. You may have this scan if you are at high risk for osteoporosis.  Discuss your test results, treatment options, and if necessary, the need for more tests with your health care provider. Vaccines Your health care provider may recommend certain vaccines, such as:  Influenza vaccine. This is recommended every year.  Tetanus, diphtheria, and acellular pertussis (Tdap, Td) vaccine. You may need a Td booster every 10 years.  Varicella vaccine. You may need this if you have not been vaccinated.  Zoster vaccine. You may need this  after age 76.  Measles, mumps, and rubella (MMR) vaccine. You may need at least one dose of MMR if you were born in 1957 or later. You may also need a second dose.  Pneumococcal 13-valent conjugate (PCV13) vaccine. You may need this if you have certain conditions and were not previously vaccinated.  Pneumococcal polysaccharide (PPSV23) vaccine. You may need one or two doses if you smoke  cigarettes or if you have certain conditions.  Meningococcal vaccine. You may need this if you have certain conditions.  Hepatitis A vaccine. You may need this if you have certain conditions or if you travel or work in places where you may be exposed to hepatitis A.  Hepatitis B vaccine. You may need this if you have certain conditions or if you travel or work in places where you may be exposed to hepatitis B.  Haemophilus influenzae type b (Hib) vaccine. You may need this if you have certain conditions.  Talk to your health care provider about which screenings and vaccines you need and how often you need them. This information is not intended to replace advice given to you by your health care provider. Make sure you discuss any questions you have with your health care provider. Document Released: 01/11/2016 Document Revised: 09/03/2016 Document Reviewed: 10/16/2015 Elsevier Interactive Patient Education  2017 [employer].

## 2017-11-07 LAB — LIPID PANEL
CHOLESTEROL: 190 mg/dL (ref <200)
HDL: 46 mg/dL — ABNORMAL LOW (ref >50)
LDL CHOLESTEROL (CALC): 117 mg/dL — AB
Non-HDL Cholesterol (Calc): 144 mg/dL (calc) — ABNORMAL HIGH (ref <130)
TRIGLYCERIDES: 156 mg/dL — AB (ref <150)
Total CHOL/HDL Ratio: 4.1 (calc) (ref <5.0)

## 2017-11-07 LAB — CBC
HCT: 44.7 % (ref 35.0–45.0)
Hemoglobin: 14.8 g/dL (ref 11.7–15.5)
MCH: 29.1 pg (ref 27.0–33.0)
MCHC: 33.1 g/dL (ref 32.0–36.0)
MCV: 88 fL (ref 80.0–100.0)
MPV: 11.9 fL (ref 7.5–12.5)
PLATELETS: 289 10*3/uL (ref 140–400)
RBC: 5.08 10*6/uL (ref 3.80–5.10)
RDW: 12.6 % (ref 11.0–15.0)
WBC: 8.9 10*3/uL (ref 3.8–10.8)

## 2017-11-07 LAB — HEMOGLOBIN A1C
HEMOGLOBIN A1C: 5.4 %{Hb} (ref <5.7)
MEAN PLASMA GLUCOSE: 108 (calc)
eAG (mmol/L): 6 (calc)

## 2017-11-07 LAB — COMPREHENSIVE METABOLIC PANEL
AG RATIO: 1.5 (calc) (ref 1.0–2.5)
ALT: 21 U/L (ref 6–29)
AST: 23 U/L (ref 10–35)
Albumin: 4 g/dL (ref 3.6–5.1)
Alkaline phosphatase (APISO): 63 U/L (ref 33–130)
BUN: 13 mg/dL (ref 7–25)
CALCIUM: 9.5 mg/dL (ref 8.6–10.4)
CO2: 24 mmol/L (ref 20–32)
Chloride: 104 mmol/L (ref 98–110)
Creat: 0.83 mg/dL (ref 0.50–1.05)
GLUCOSE: 86 mg/dL (ref 65–99)
Globulin: 2.6 g/dL (calc) (ref 1.9–3.7)
Potassium: 3.8 mmol/L (ref 3.5–5.3)
Sodium: 139 mmol/L (ref 135–146)
Total Bilirubin: 0.4 mg/dL (ref 0.2–1.2)
Total Protein: 6.6 g/dL (ref 6.1–8.1)

## 2017-11-07 LAB — HEPATITIS C ANTIBODY
Hepatitis C Ab: NONREACTIVE
SIGNAL TO CUT-OFF: 0.01 (ref <1.00)

## 2017-11-27 ENCOUNTER — Encounter: Payer: Self-pay | Admitting: Family Medicine

## 2017-12-03 ENCOUNTER — Encounter: Payer: Self-pay | Admitting: Family Medicine

## 2017-12-03 ENCOUNTER — Encounter: Payer: Self-pay | Admitting: Surgical

## 2017-12-03 DIAGNOSIS — L989 Disorder of the skin and subcutaneous tissue, unspecified: Secondary | ICD-10-CM | POA: Insufficient documentation

## 2017-12-09 ENCOUNTER — Encounter: Payer: Self-pay | Admitting: Family Medicine

## 2017-12-10 ENCOUNTER — Other Ambulatory Visit: Payer: Self-pay | Admitting: Family Medicine

## 2017-12-11 NOTE — Telephone Encounter (Signed)
Ok to refill. She will need to schedule an appointment with me in the next few months to discuss since it is a controlled medication.  Algis Greenhouse. Jerline Pain, MD 12/11/2017 2:08 PM

## 2018-01-15 ENCOUNTER — Other Ambulatory Visit: Payer: Self-pay

## 2018-01-15 ENCOUNTER — Telehealth: Payer: Self-pay | Admitting: Family Medicine

## 2018-01-15 DIAGNOSIS — Z7689 Persons encountering health services in other specified circumstances: Secondary | ICD-10-CM

## 2018-01-15 NOTE — Telephone Encounter (Signed)
Referral has been placed. 

## 2018-01-15 NOTE — Telephone Encounter (Signed)
Copied from [place] #[id number]. Topic: Quick Communication - See Telephone Encounter >> Jan 15, 2018 10:35 AM Clack, Laban Emperor wrote: CRM for notification. See Telephone encounter for: Pt states that her OBGYN is retiring on 03/28/18 and would like to know if Dr. Jerline Pain would recommend her to some gynecologist in the [place] area.  Please f/u with pt.  01/15/18.

## 2018-01-15 NOTE — Telephone Encounter (Signed)
Ok to place referral.

## 2018-01-15 NOTE — Telephone Encounter (Signed)
LM on patient's VM letting her know referral has been placed.

## 2018-01-15 NOTE — Telephone Encounter (Signed)
Ok with me. Please place any necessary orders. 

## 2018-01-18 ENCOUNTER — Telehealth: Payer: Self-pay | Admitting: Obstetrics and Gynecology

## 2018-01-18 NOTE — Telephone Encounter (Signed)
Called and left a message for patient to call back to schedule a new patient doctor referral appointment with our office for an AEX.

## 2018-01-30 ENCOUNTER — Other Ambulatory Visit: Payer: Self-pay | Admitting: Family Medicine

## 2018-02-01 NOTE — Telephone Encounter (Signed)
Rx sent in. Please ask patient to schedule appointment for future refills.  Algis Greenhouse. Jerline Pain, MD 02/01/2018 8:38 AM

## 2018-02-01 NOTE — Telephone Encounter (Signed)
Please advise 

## 2018-02-15 ENCOUNTER — Ambulatory Visit (INDEPENDENT_AMBULATORY_CARE_PROVIDER_SITE_OTHER): Payer: BLUE CROSS/BLUE SHIELD | Admitting: Family Medicine

## 2018-02-15 ENCOUNTER — Encounter: Payer: Self-pay | Admitting: Family Medicine

## 2018-02-15 DIAGNOSIS — F419 Anxiety disorder, unspecified: Secondary | ICD-10-CM | POA: Diagnosis not present

## 2018-02-15 DIAGNOSIS — I10 Essential (primary) hypertension: Secondary | ICD-10-CM | POA: Diagnosis not present

## 2018-02-15 MED ORDER — CLONAZEPAM 1 MG PO TABS: 1.0000 mg | ORAL_TABLET | Freq: Three times a day (TID) | ORAL | 5 refills | Status: DC | PRN

## 2018-02-15 NOTE — Assessment & Plan Note (Addendum)
Stable.  Klonopin refilled.  Database reviewed without red flags.  Discussed risk of chronic benzo use including increased risk of increased sedation, falls, and memory loss.  Also discussed risk of respiratory depression.  Follow-up in 6 months, or sooner as needed.  Consider addition of SSRI if needs escalating dose of benzo.

## 2018-02-15 NOTE — Progress Notes (Signed)
   Subjective:  Alice Hodges is a 57 y.o. female who presents today with a chief complaint of anxiety.   HPI:  Anxiety, Established Problem Patient currently on Klonopin 1 mg twice daily as needed.  She has done well with this dose.  No obvious side effects or adverse reactions.  No dizziness, excessive sedation, forgetfulness, or falls.  She has noted increased life stress recently.  She recently started back school and is studying computer program.  This is caused her a significant amount of increased stress.  Also recently found out that her 28-month-old grandchild will be requiring surgery for cancer.  There has been a difficult time for them, however she thinks that she is handling it well.  No SI or HI.  Hypertension, established problem, stable Currently on lisinopril 10 mg daily and HCTZ 25 mg daily.  Tolerates both these doses well without any side effects.  No chest pain or shortness of breath.  ROS: Per HPI  Objective:  Physical Exam: BP 118/80   Pulse 70   Temp 98.6 F (37 C) (Oral)   Wt 190 lb 12.8 oz (86.5 kg)   LMP 02/15/2018   SpO2 94%   BMI 32.24 kg/m   Gen: NAD, resting comfortably CV: RRR with no murmurs appreciated Pulm: NWOB, CTAB with no crackles, wheezes, or rhonchi Psych: Intermittently tearful.  No SI or HI.  Normal thought content.  No apparent AVH.  Assessment/Plan:  Anxiety Stable.  Klonopin refilled.  Database reviewed without red flags.  Discussed risk of chronic benzo use including increased risk of increased sedation, falls, and memory loss.  Also discussed risk of respiratory depression.  Follow-up in 6 months, or sooner as needed.  Consider addition of SSRI if needs escalating dose of benzo.  Essential hypertension At goal.  Continue lisinopril and HCTZ.  Algis Greenhouse. Jerline Pain, MD 02/15/2018 3:18 PM

## 2018-02-15 NOTE — Assessment & Plan Note (Signed)
At goal. Continue lisinopril and HCTZ. 

## 2018-03-03 ENCOUNTER — Telehealth: Payer: Self-pay | Admitting: Obstetrics and Gynecology

## 2018-03-03 ENCOUNTER — Encounter: Payer: Self-pay | Admitting: Obstetrics and Gynecology

## 2018-03-03 ENCOUNTER — Ambulatory Visit (INDEPENDENT_AMBULATORY_CARE_PROVIDER_SITE_OTHER): Payer: BLUE CROSS/BLUE SHIELD | Admitting: Obstetrics and Gynecology

## 2018-03-03 ENCOUNTER — Other Ambulatory Visit: Payer: Self-pay

## 2018-03-03 VITALS — BP 110/70 | HR 64 | Resp 16 | Ht 64.5 in | Wt 187.0 lb

## 2018-03-03 DIAGNOSIS — Z9889 Other specified postprocedural states: Secondary | ICD-10-CM | POA: Diagnosis not present

## 2018-03-03 DIAGNOSIS — Z79890 Hormone replacement therapy (postmenopausal): Secondary | ICD-10-CM

## 2018-03-03 DIAGNOSIS — N939 Abnormal uterine and vaginal bleeding, unspecified: Secondary | ICD-10-CM | POA: Diagnosis not present

## 2018-03-03 DIAGNOSIS — N6310 Unspecified lump in the right breast, unspecified quadrant: Secondary | ICD-10-CM

## 2018-03-03 NOTE — Progress Notes (Signed)
57 y.o. [id number] MarriedCaucasianF here for c/o irregular menstrual cycles.  Normal CBC and TSH in 11/8.  She has had an evaluation by her prior GYN who is retiring. She has a h/o an endometrial ablation in 2002. Last summer she had an endometrial biopsy. Her MD started her on HRT in th fall because of her abnormal bleeding, not because of vasomotor symptoms. No help with cyclic progesterone.   She is bleeding about every 2 weeks and can bleed for 5-9 days.  Over the last 1-2 weeks her bleeding has stopped, but she has brown d/c. Only mild cramping for a day with the start of bleeding. No dyspareunia.   H/O ectopic x 2, methotrexate x 2, needed surgery with the second one. She had a tubal ligation at the time of treatment of her last ectopic. Thinks she had some endometriosis at that time.     Period Duration (Days): 5-9 days  Period Pattern: (!) Irregular Menstrual Flow: Heavy Menstrual Control: Tampon Menstrual Control Change Freq (Hours): changes tampon every 3 hours  Dysmenorrhea: None  Patient's last menstrual period was 02/15/2018.          Sexually active: Yes.    The current method of family planning is tubal ligation.    Exercising: Yes.    walking  Smoker:  Former smoker   Health Maintenance: Pap:  07-21-16 WNL, patient reports normal pap in 8/18. History of abnormal Pap:  no MMG:  08-27-17 WNL Colonoscopy:  01-10-14 WNL BMD:   02-11-12 WNL  TDaP:  04-26-15 Gardasil: no    reports that she has quit smoking. Her smoking use included cigarettes. She has a 6.00 pack-year smoking history. she has never used smokeless tobacco. She reports that she drinks alcohol. She reports that she does not use drugs. Rare ETOH. She has 2 step sons. 2 grand daughters, they both have cancer, they have a hereditary condition (they inherited it from their mothers side). Dicer 1 syndrome, very rare. They are almost 2 and almost 4. She is going to school to become a para-legal.    Past Medical History:   Diagnosis Date  . Abnormal uterine bleeding   . Anxiety   . Cancer ([place])    skin   . Depression   . Dysmenorrhea   . High cholesterol   . Hormone disorder   . Hypertension   . Infertility, female   . Restless leg syndrome   Depression is good, still issues with anxiety. She is seeing her primary.  Working on diet and exercise to help her HTN and lipids. Exercising 5 days a week.   Past Surgical History:  Procedure Laterality Date  . ABDOMINAL SURGERY    . BACK SURGERY  03/2000   And then again in December 2001  . BREAST BIOPSY N/A   . CHOLECYSTECTOMY  12/2010  . DILATION AND CURETTAGE OF UTERUS  08/1996  . ENDOMETRIAL ABLATION    . GYNECOLOGIC CRYOSURGERY    . MOHS SURGERY  2016   left side of nose   . TUBAL LIGATION  09/1999    Current Outpatient Medications  Medication Sig Dispense Refill  . aspirin EC 81 MG tablet Take 81 mg by mouth daily.    . clonazePAM (KLONOPIN) 1 MG tablet Take 1 tablet (1 mg total) by mouth 3 (three) times daily as needed for anxiety. 90 tablet 5  . estradiol (ESTRACE) 1 MG tablet Take by mouth.    . Garlic 1308 MG CAPS Take 1,000 mg  by mouth daily.    . hydrochlorothiazide (HYDRODIURIL) 25 MG tablet TAKE 1 TABLET BY MOUTH EVERY DAY **PT MUST MAKE APPT**    . ibuprofen (ADVIL,MOTRIN) 200 MG tablet Take 400 mg by mouth every 6 (six) hours as needed (pain).    Marland Kitchen KRILL OIL PO Take 1 capsule by mouth daily. 300 MG    . lisinopril (PRINIVIL,ZESTRIL) 10 MG tablet Take 10 mg by mouth daily.    Marland Kitchen loratadine (CLARITIN) 10 MG tablet Take 10 mg daily by mouth.    . medroxyPROGESTERone (PROVERA) 2.5 MG tablet     . Melatonin 10 MG CAPS Take 10 mg by mouth daily as needed.    . Multiple Vitamin (MULTIVITAMIN WITH MINERALS) TABS tablet Take 1 tablet by mouth daily.    . Multiple Vitamins-Minerals (PRESERVISION AREDS 2 PO) Take 1 capsule by mouth daily.    Marland Kitchen omeprazole (PRILOSEC) 20 MG capsule Take 20 mg by mouth every other day.    . Red Yeast Rice 600 MG  TABS Take 600 mg by mouth daily.     No current facility-administered medications for this visit.     Family History  Problem Relation Age of Onset  . Rheumatic fever Mother 34       Rheumatic heart disease - she died in childbirth;  . Early death Mother   . Hearing loss Mother   . Heart disease Mother   . Heart attack Mother   . Hypertension Father   . COPD Father        Also paternal uncle  . Alcohol abuse Father   . Kidney disease Father   . Polycythemia Sister   . Depression Sister   . Coronary artery disease Paternal Grandmother   . Heart attack Paternal Grandmother   . Heart disease Paternal Grandmother   . Depression Sister   . Lung cancer Paternal Uncle        As well as 3 maternal aunts  . Alcohol abuse Maternal Grandfather   . COPD Maternal Grandfather   . Diabetes Maternal Grandfather   . Stroke Maternal Grandfather   . Alcohol abuse Paternal Grandfather   . Mental illness Paternal Grandfather   . Lung cancer Maternal Aunt   . Brain cancer Maternal Aunt   Mom died in childbirth with her. She had a wonderful step-mom  Review of Systems  Constitutional: Negative.   HENT: Negative.   Eyes: Negative.   Respiratory: Negative.   Cardiovascular: Negative.   Gastrointestinal: Negative.   Endocrine: Negative.   Genitourinary: Positive for menstrual problem.       Irregular menstrual bleeding Heavy bleeding   Musculoskeletal: Negative.   Skin: Negative.   Allergic/Immunologic: Negative.   Neurological: Negative.   Psychiatric/Behavioral: Negative.     Exam:   BP 110/70 (BP Location: Right Arm, Patient Position: Sitting, Cuff Size: Normal)   Pulse 64   Resp 16   Ht 5' 4.5" (1.638 m)   Wt 187 lb (84.8 kg)   LMP 02/15/2018   BMI 31.60 kg/m   Weight change: @WEIGHTCHANGE @ Height:   Height: 5' 4.5" (163.8 cm)  Ht Readings from Last 3 Encounters:  03/03/18 5' 4.5" (1.638 m)  11/06/17 5' 4.5" (1.638 m)  04/30/17 5' 5.5" (1.664 m)    General appearance:  alert, cooperative and appears stated age Head: Normocephalic, without obvious abnormality, atraumatic Neck: no adenopathy, supple, symmetrical, trachea midline and thyroid normal to inspection and palpation Lungs: clear to auscultation bilaterally Cardiovascular: regular rate and rhythm  Breasts: normal appearance, no masses or tenderness Abdomen: soft, non-tender; non distended,  no masses,  no organomegaly Extremities: extremities normal, atraumatic, no cyanosis or edema Skin: Skin color, texture, turgor normal. No rashes or lesions Lymph nodes: Cervical, supraclavicular, and axillary nodes normal. No abnormal inguinal nodes palpated Neurologic: Grossly normal   Pelvic: External genitalia:  no lesions              Urethra:  normal appearing urethra with no masses, tenderness or lesions              Bartholins and Skenes: normal                 Vagina: normal appearing vagina with normal color and discharge, no lesions              Cervix: no lesions               Bimanual Exam:  Uterus:  normal size, contour, position, consistency, mobility, non-tender              Adnexa: no mass, fullness, tenderness               Rectovaginal: Confirms               Anus:  normal sphincter tone, no lesions  Chaperone was present for exam.  Review of records: 10/23/1999: op note reviewed. Laparoscopy with bipolar coagulation of left tube and right salpingectomy. DX: tubal pregnancy and elective sterilization. 2-3 foci of endometriosis were noted on the peritoneal surfaces.  07/27/01: op note reviewed. Cryoablation of the endometrium.  DEXA in 11/09: osteopenia, spine with T score of -1.5 DEXA in 2/13: Spine T score of -1.0, femoral neck -0.6. Normal Reported hypercalcemia with HCTZ use  06/29/14: postmenopausal bleeding on unopposed estrogen (she was on syntest which is a combination estrogen/testosterone medication). Ultrasound with 7 x 9 mm polyp 06/30/14: FSH was 26.7 (just in menopausal range),  estradiol was 24.1 07/09/15: endometrial biopsy done for postmenopausal bleeding on unopposed ERT: returned with benign endometrial polyps 07/21/16: FSH was 12.7 and estradiol was 30 (but she was on ERT) 05/06/17: note in chart about a CT scan that showed a 2 cm right ovarian cyst and a 8 mm endometrial lesion Endometrial biopsy on 05/07/17 disordered endometrium with proliferative and secretory endometrium, no hyperplasia or cancer. Pap on 07/28/17 negative Note from 07/28/17 discussed ultrasound: "very thin endometrium" 08/27/17 mammogram cat 3, recommended bilateral diagnostic mammogram in one year Note from 09/08/17: started on estradiol 1 mg BID for continued bleeding with 2 prior biopsies and ultrasounds without intracavitary lesions.  On 10/14/17 she was started on provera 2.5 mg a day. At that time a discussion about options of an endometrial ablation and TLH/SO were discussed.    A:  Abnormal uterine bleeding for several years, on unopposed estrogen with testosterone for years. Had multiple endometrial biopsies for AUB. On HRT for a few months. Had a polyp removed in 9/18. Prior GYN recommended hysterectomy or repeat ablation  She reports a low calcium in the past. Last bone density was normal (prior one with osteopenia)  She is on HRT 1 mg BID and provera 2.5 mg a day (can't use the patch has an allergy)  History of unopposed estrogen use for years   P:   Extensive records reviewed  Recommend she return for an ultrasound, possible sonohysterogram, possible repeat endometrial biopsy  Discussed that I don't think HRT is the answer  She will drop to  1 mg a day of estrace and continue with the 2.5 mg a day of provera, if she is tolerating this will go down to 0.5 mg a day and then see about getting her off of it  We briefly discussed the possible option of a mirena IUD depending on her work up.  Her labs have shown a low FSH, but this has all been on estrogen. She may very well be PMP.  She needs  a f/u diagnostic mammogram in 9/19.  ~45 minutes was spent face to face with the patient, over 50% in counseling. At least another 30 minutes spent reviewing her old records.

## 2018-03-03 NOTE — Telephone Encounter (Signed)
Please let the patient know that I have reviewed her records. She was on unopposed estrogen (with testosterone for years). This increases her risk of uterine cancer. She has had several benign endometrial biopsy. I would recommend that she return for a sonohysterogram, possible repeat endometrial biopsy. Please place these orders once you speak with her. She needs a f/u diagnostic mammogram in early 9/19. I don't need her records from the 90's, see if she wants those records back. The rest of her records will get scanned into her chart.  Thanks!

## 2018-03-03 NOTE — Patient Instructions (Signed)
Abnormal Uterine Bleeding Abnormal uterine bleeding can affect women at various stages in life, including teenagers, women in their reproductive years, pregnant women, and women who have reached menopause. Several kinds of uterine bleeding are considered abnormal, including:  Bleeding or spotting between periods.  Bleeding after sexual intercourse.  Bleeding that is heavier or more than normal.  Periods that last longer than usual.  Bleeding after menopause. Many cases of abnormal uterine bleeding are minor and simple to treat, while others are more serious. Any type of abnormal bleeding should be evaluated by your health care provider. Treatment will depend on the cause of the bleeding. Follow these instructions at home: Monitor your condition for any changes. The following actions may help to alleviate any discomfort you are experiencing:  Avoid the use of tampons and douches as directed by your health care provider.  Change your pads frequently. You should get regular pelvic exams and Pap tests. Keep all follow-up appointments for diagnostic tests as directed by your health care provider. Contact a health care provider if:  Your bleeding lasts more than 1 week.  You feel dizzy at times. Get help right away if:  You pass out.  You are changing pads every 15 to 30 minutes.  You have abdominal pain.  You have a fever.  You become sweaty or weak.  You are passing large blood clots from the vagina.  You start to feel nauseous and vomit. This information is not intended to replace advice given to you by your health care provider. Make sure you discuss any questions you have with your health care provider. Document Released: 12/15/2005 Document Revised: 05/28/2016 Document Reviewed: 07/14/2013 Elsevier Interactive Patient Education  2017 [employer]  

## 2018-03-04 NOTE — Telephone Encounter (Signed)
Spoke with patient advised appointment for bilateral diagnostic mammogram scheduled for 08/31/2018 at 3:30 pm at the [facility]. Patient verbalizes understanding. Placed in mammogram recall. Patient would like to reschedule [facility] and EMB. Appointment moved to 03/09/2018 at 3:30 pm with 4 pm consult with Dr.Jertson. Patient is agreeable to date and time.  Routing to provider for final review. Patient agreeable to disposition. Will close encounter.

## 2018-03-04 NOTE — Telephone Encounter (Signed)
My understanding was that she needed f/u bilateral diagnostic mammograms.

## 2018-03-04 NOTE — Telephone Encounter (Signed)
Spoke with patient. Advised of message as seen below from [place]. Patient verbalizes understanding. Appointment for [facility] and possible EMB scheduled for 03/18/2018 at 4 pm with 4:15 pm consult with Dr.Jertson. Patient verbalizes understanding. Does not want records from the 90s. Advised will review needed mammogram with Dr.Jertson and [facility] for scheduling and return call.   Dr.Jertson please advise if patient needs diagnostic bilateral or unilateral mammogram.

## 2018-03-08 ENCOUNTER — Other Ambulatory Visit: Payer: Self-pay

## 2018-03-08 DIAGNOSIS — N939 Abnormal uterine and vaginal bleeding, unspecified: Secondary | ICD-10-CM

## 2018-03-09 ENCOUNTER — Other Ambulatory Visit: Payer: Self-pay | Admitting: Obstetrics and Gynecology

## 2018-03-09 ENCOUNTER — Ambulatory Visit (INDEPENDENT_AMBULATORY_CARE_PROVIDER_SITE_OTHER): Payer: BLUE CROSS/BLUE SHIELD

## 2018-03-09 ENCOUNTER — Encounter: Payer: Self-pay | Admitting: Obstetrics and Gynecology

## 2018-03-09 ENCOUNTER — Other Ambulatory Visit: Payer: Self-pay

## 2018-03-09 ENCOUNTER — Ambulatory Visit (INDEPENDENT_AMBULATORY_CARE_PROVIDER_SITE_OTHER): Payer: BLUE CROSS/BLUE SHIELD | Admitting: Obstetrics and Gynecology

## 2018-03-09 VITALS — BP 150/84 | HR 72 | Resp 16 | Wt 186.0 lb

## 2018-03-09 DIAGNOSIS — Z79890 Hormone replacement therapy (postmenopausal): Secondary | ICD-10-CM | POA: Diagnosis not present

## 2018-03-09 DIAGNOSIS — N840 Polyp of corpus uteri: Secondary | ICD-10-CM | POA: Diagnosis not present

## 2018-03-09 DIAGNOSIS — N939 Abnormal uterine and vaginal bleeding, unspecified: Secondary | ICD-10-CM

## 2018-03-09 NOTE — Progress Notes (Signed)
GYNECOLOGY  VISIT   HPI: 57 y.o.   Married  Caucasian  female   G2P0020 with Patient's last menstrual period was 02/15/2018.   here for f/u of AUB on HRT, previously on unopposed ERT. She has had biopsies with polyps, but no hysteroscopy, D&C. At her visit last week her oral estrace was reduced to 1 mg po qd, she is on provera daily. Can't use the patch.  H/O endometrial ablation in 2002    GYNECOLOGIC HISTORY: Patient's last menstrual period was 02/15/2018. Contraception: Tubal ligation Menopausal hormone therapy: HRT        OB History    Gravida Para Term Preterm AB Living   2       2     SAB TAB Ectopic Multiple Live Births       2             Patient Active Problem List   Diagnosis Date Noted  . Anxiety 02/15/2018  . Skin lesions 12/03/2017  . Restless leg syndrome 11/06/2017  . Chest pain with moderate risk for cardiac etiology 04/30/2017  . Abnormal finding on EKG 04/30/2017  . Dyslipidemia 04/30/2017  . Essential hypertension 04/30/2017    Past Medical History:  Diagnosis Date  . Abnormal uterine bleeding   . Anxiety   . Cancer ([place])    skin   . Depression   . Dysmenorrhea   . High cholesterol   . Hormone disorder   . Hypertension   . Infertility, female   . Restless leg syndrome     Past Surgical History:  Procedure Laterality Date  . ABDOMINAL SURGERY    . BACK SURGERY  03/2000   And then again in December 2001  . BREAST BIOPSY N/A   . CHOLECYSTECTOMY  12/2010  . DILATION AND CURETTAGE OF UTERUS  08/1996  . ENDOMETRIAL ABLATION    . GYNECOLOGIC CRYOSURGERY    . MOHS SURGERY  2016   left side of nose   . TUBAL LIGATION  09/1999    Current Outpatient Medications  Medication Sig Dispense Refill  . aspirin EC 81 MG tablet Take 81 mg by mouth daily.    . clonazePAM (KLONOPIN) 1 MG tablet Take 1 tablet (1 mg total) by mouth 3 (three) times daily as needed for anxiety. 90 tablet 5  . estradiol (ESTRACE) 1 MG tablet Take by mouth.    . Garlic 9357  MG CAPS Take 1,000 mg by mouth daily.    . hydrochlorothiazide (HYDRODIURIL) 25 MG tablet TAKE 1 TABLET BY MOUTH EVERY DAY **PT MUST MAKE APPT**    . ibuprofen (ADVIL,MOTRIN) 200 MG tablet Take 400 mg by mouth every 6 (six) hours as needed (pain).    Marland Kitchen KRILL OIL PO Take 1 capsule by mouth daily. 300 MG    . lisinopril (PRINIVIL,ZESTRIL) 10 MG tablet Take 10 mg by mouth daily.    Marland Kitchen loratadine (CLARITIN) 10 MG tablet Take 10 mg daily by mouth.    . medroxyPROGESTERone (PROVERA) 2.5 MG tablet     . Melatonin 10 MG CAPS Take 10 mg by mouth daily as needed.    . Multiple Vitamin (MULTIVITAMIN WITH MINERALS) TABS tablet Take 1 tablet by mouth daily.    . Multiple Vitamins-Minerals (PRESERVISION AREDS 2 PO) Take 1 capsule by mouth daily.    Marland Kitchen omeprazole (PRILOSEC) 20 MG capsule Take 20 mg by mouth every other day.    . Red Yeast Rice 600 MG TABS Take 600 mg by mouth  daily.     No current facility-administered medications for this visit.      ALLERGIES: Tape  Family History  Problem Relation Age of Onset  . Rheumatic fever Mother 23       Rheumatic heart disease - she died in childbirth;  . Early death Mother   . Hearing loss Mother   . Heart disease Mother   . Heart attack Mother   . Hypertension Father   . COPD Father        Also paternal uncle  . Alcohol abuse Father   . Kidney disease Father   . Polycythemia Sister   . Depression Sister   . Coronary artery disease Paternal Grandmother   . Heart attack Paternal Grandmother   . Heart disease Paternal Grandmother   . Depression Sister   . Lung cancer Paternal Uncle        As well as 3 maternal aunts  . Alcohol abuse Maternal Grandfather   . COPD Maternal Grandfather   . Diabetes Maternal Grandfather   . Stroke Maternal Grandfather   . Alcohol abuse Paternal Grandfather   . Mental illness Paternal Grandfather   . Lung cancer Maternal Aunt   . Brain cancer Maternal Aunt     Social History   Socioeconomic History  . Marital  status: Married    Spouse name: Not on file  . Number of children: Not on file  . Years of education: Not on file  . Highest education level: Bachelor's degree (e.g., BA, AB, BS)  Social Needs  . Financial resource strain: Not on file  . Food insecurity - worry: Not on file  . Food insecurity - inability: Not on file  . Transportation needs - medical: Not on file  . Transportation needs - non-medical: Not on file  Occupational History  . Occupation: Self Employed  Tobacco Use  . Smoking status: Former Smoker    Packs/day: 0.50    Years: 12.00    Pack years: 6.00    Types: Cigarettes  . Smokeless tobacco: Never Used  Substance and Sexual Activity  . Alcohol use: Yes    Alcohol/week: 0.0 - 0.6 oz    Frequency: Never  . Drug use: No  . Sexual activity: Yes    Partners: Male    Birth control/protection: Surgical    Comment: Tubal Ligaton   Other Topics Concern  . Not on file  Social History Narrative   She and her husband Clair Gulling) recently moved from [facility] down to [place] for better job opportunities. 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 She is now ready to set herself back up working.      - second marriage. No children. Her current husband Clair Gulling) has children and grandchildren.Granddaughter just diagnosed with cancer   She is starting to try to go out and exercise with her husband walking on it routinely.    Review of Systems  Gastrointestinal: Positive for constipation.  All other systems reviewed and are negative.   PHYSICAL EXAMINATION:    BP (!) 150/84 (BP Location: Right Arm, Patient Position: Sitting, Cuff Size: Normal)   Pulse 72   Resp 16   Wt 186 lb (84.4 kg)   LMP 02/15/2018   BMI 31.43 kg/m     General appearance: alert, cooperative and appears stated age Neck: no adenopathy, supple, symmetrical, trachea  midline and  thyroid normal to inspection and palpation Heart: regular rate and rhythm Lungs: CTAB Abdomen: soft, non-tender; bowel sounds normal; no masses,  no organomegaly Extremities: normal, atraumatic, no cyanosis Skin: normal color, texture and turgor, no rashes or lesions Lymph: normal cervical supraclavicular and inguinal nodes Neurologic: grossly normal   Pelvic: External genitalia:  no lesions              Urethra:  normal appearing urethra with no masses, tenderness or lesions              Bartholins and Skenes: normal                 Vagina: normal appearing vagina with normal color and discharge, no lesions              Cervix:without lesions  Sonohysterogram The procedure and risks of the procedure were reviewed with the patient, consent form was signed. A speculum was placed in the vagina and the cervix was cleansed with betadine. The sonohysterogram catheter was inserted into the uterine cavity without difficulty. Saline was infused under direct observation with the ultrasound. An intracavitary defect was noted c/w an endometrial polyp.The catheter was removed.    Chaperone was present for exam.  ASSESSMENT AUB on HRT, suspect menopausal Intracavitary defect noted on ultrasound, c/w endometrial polyp   PLAN Plan: hysteroscopy, polypectomy, dilation and curettage. Reviewed risks, including: bleeding, infection, uterine perforation, fluid overload, need for further sugery    An After Visit Summary was printed and given to the patient.  ~15 minutes face to face time of which over 50% was spent in counseling.

## 2018-03-17 ENCOUNTER — Telehealth: Payer: Self-pay | Admitting: Obstetrics and Gynecology

## 2018-03-17 NOTE — Telephone Encounter (Signed)
Call to patient to schedule surgery. Date options discussed. Patient selects to proceed with 04-05-18. Surgery instruction sheet reviewed.  Printed copy will be mailed with brochure from surgery center.  Patient reports that with decrese in estrogen dose, she has not had any bleeding for four days. Understands importance of proceeding as planned. Just wants Dr Talbert Nan to have update.   Routing to provider for final review. Patient agreeable to disposition. Will close encounter.

## 2018-03-17 NOTE — Telephone Encounter (Signed)
Spoke with patient regarding benefit for recommended surgery. Patient understood and agreeable. Patient has confirmed and is ready to proceed with scheduling. Patient aware this is professional benefit only. Patient aware will be contacted by hospital for separate benefits. Forwarding to Conservation officer, historic buildings for scheduling.  Forwarding to Lamont Snowball, RN

## 2018-03-18 ENCOUNTER — Other Ambulatory Visit: Payer: BLUE CROSS/BLUE SHIELD | Admitting: Obstetrics and Gynecology

## 2018-03-18 ENCOUNTER — Other Ambulatory Visit: Payer: BLUE CROSS/BLUE SHIELD

## 2018-03-19 ENCOUNTER — Telehealth: Payer: Self-pay | Admitting: Obstetrics and Gynecology

## 2018-03-19 NOTE — Telephone Encounter (Signed)
Patient called requesting to speak with Alice Hodges to schedule surgery and also give an update after seeing another physician.

## 2018-03-19 NOTE — Telephone Encounter (Signed)
Call to patient. She saw PCP this am related to a visit for her husband and discussed clearance for upcoming surgery.  States PCP reviewed her records and will send surgery clearance for surgery. Dr Marigene Ehlers office will contact us and patient requests I send fax number through My Chart message to her.

## 2018-03-21 ENCOUNTER — Encounter: Payer: Self-pay | Admitting: Family Medicine

## 2018-03-23 ENCOUNTER — Telehealth: Payer: Self-pay | Admitting: Family Medicine

## 2018-03-23 NOTE — Telephone Encounter (Signed)
Patient called requesting to speak with Gay Filler to follow up on last week's conversation.

## 2018-03-23 NOTE — Telephone Encounter (Signed)
Return call to patient. She is checking on PCP clearance. Advised have not received this yet but can see messages in Epic that they are working on this within their office.  Will advise when received.   Also called patient back to notify of surgery time change to 0730 on 04-05-18.

## 2018-03-23 NOTE — Telephone Encounter (Signed)
Copied from [place]. Topic: Quick Communication - See Telephone Encounter >> Mar 23, 2018  9:11 AM Ether Griffins B wrote: CRM for notification. See Telephone encounter for: 03/23/18. Pt checking on mychart message she sent on 03/21/18.

## 2018-03-24 NOTE — Telephone Encounter (Signed)
Patient sent the following correspondence through [place]. Routing to [place] to assist patient with request.  ----- Message from [place], Generic sent at 03/24/2018 11:08 AM EDT -----    Alice Hodges,  My primary care physician is faxing over a note of release based on the discussion we had while I was with my husband for an appointment last week. Do you need that to be an office note based on a specific appointment with me?  There seems to be some confusion about what is required for this release.  Thanks  ----- Message -----  From: Nurse Gilles Chiquito  Sent: 03/19/2018 11:28 AM EDT  To: Alice Hodges  Subject: Fax number  Alice Hodges,    Per our discussion, your primary care physician can send his office note to our office.  Fax number- [phone]   Attention: Alice Hodges    Thank you    Lamont Snowball, RN  Clinical Supervisor

## 2018-03-25 ENCOUNTER — Ambulatory Visit: Payer: BLUE CROSS/BLUE SHIELD | Admitting: Family Medicine

## 2018-03-25 NOTE — H&P (Signed)
GYNECOLOGY  VISIT   HPI: 57 y.o.   Married  Caucasian  female   G2P0020 with Patient's last menstrual period was 02/15/2018.   here for f/u of AUB on HRT, previously on unopposed ERT. She has had biopsies with polyps, but no hysteroscopy, D&C. At her visit last week her oral estrace was reduced to 1 mg po qd, she is on provera daily. Can't use the patch.  H/O endometrial ablation in 2002    GYNECOLOGIC HISTORY: Patient's last menstrual period was 02/15/2018. Contraception: Tubal ligation Menopausal hormone therapy: HRT                OB History    Gravida Para Term Preterm AB Living   2       2     SAB TAB Ectopic Multiple Live Births       2                 Patient Active Problem List   Diagnosis Date Noted  . Anxiety 02/15/2018  . Skin lesions 12/03/2017  . Restless leg syndrome 11/06/2017  . Chest pain with moderate risk for cardiac etiology 04/30/2017  . Abnormal finding on EKG 04/30/2017  . Dyslipidemia 04/30/2017  . Essential hypertension 04/30/2017        Past Medical History:  Diagnosis Date  . Abnormal uterine bleeding   . Anxiety   . Cancer ([place])    skin   . Depression   . Dysmenorrhea   . High cholesterol   . Hormone disorder   . Hypertension   . Infertility, female   . Restless leg syndrome          Past Surgical History:  Procedure Laterality Date  . ABDOMINAL SURGERY    . BACK SURGERY  03/2000   And then again in December 2001  . BREAST BIOPSY N/A   . CHOLECYSTECTOMY  12/2010  . DILATION AND CURETTAGE OF UTERUS  08/1996  . ENDOMETRIAL ABLATION    . GYNECOLOGIC CRYOSURGERY    . MOHS SURGERY  2016   left side of nose   . TUBAL LIGATION  09/1999          Current Outpatient Medications  Medication Sig Dispense Refill  . aspirin EC 81 MG tablet Take 81 mg by mouth daily.    . clonazePAM (KLONOPIN) 1 MG tablet Take 1 tablet (1 mg total) by mouth 3 (three) times daily as needed for anxiety. 90  tablet 5  . estradiol (ESTRACE) 1 MG tablet Take by mouth.    . Garlic 0254 MG CAPS Take 1,000 mg by mouth daily.    . hydrochlorothiazide (HYDRODIURIL) 25 MG tablet TAKE 1 TABLET BY MOUTH EVERY DAY **PT MUST MAKE APPT**    . ibuprofen (ADVIL,MOTRIN) 200 MG tablet Take 400 mg by mouth every 6 (six) hours as needed (pain).    Marland Kitchen KRILL OIL PO Take 1 capsule by mouth daily. 300 MG    . lisinopril (PRINIVIL,ZESTRIL) 10 MG tablet Take 10 mg by mouth daily.    Marland Kitchen loratadine (CLARITIN) 10 MG tablet Take 10 mg daily by mouth.    . medroxyPROGESTERone (PROVERA) 2.5 MG tablet     . Melatonin 10 MG CAPS Take 10 mg by mouth daily as needed.    . Multiple Vitamin (MULTIVITAMIN WITH MINERALS) TABS tablet Take 1 tablet by mouth daily.    . Multiple Vitamins-Minerals (PRESERVISION AREDS 2 PO) Take 1 capsule by mouth daily.    Marland Kitchen omeprazole (  PRILOSEC) 20 MG capsule Take 20 mg by mouth every other day.    . Red Yeast Rice 600 MG TABS Take 600 mg by mouth daily.     No current facility-administered medications for this visit.      ALLERGIES: Tape       Family History  Problem Relation Age of Onset  . Rheumatic fever Mother 39       Rheumatic heart disease - she died in childbirth;  . Early death Mother   . Hearing loss Mother   . Heart disease Mother   . Heart attack Mother   . Hypertension Father   . COPD Father        Also paternal uncle  . Alcohol abuse Father   . Kidney disease Father   . Polycythemia Sister   . Depression Sister   . Coronary artery disease Paternal Grandmother   . Heart attack Paternal Grandmother   . Heart disease Paternal Grandmother   . Depression Sister   . Lung cancer Paternal Uncle        As well as 3 maternal aunts  . Alcohol abuse Maternal Grandfather   . COPD Maternal Grandfather   . Diabetes Maternal Grandfather   . Stroke Maternal Grandfather   . Alcohol abuse Paternal Grandfather   . Mental illness  Paternal Grandfather   . Lung cancer Maternal Aunt   . Brain cancer Maternal Aunt     Social History        Socioeconomic History  . Marital status: Married    Spouse name: Not on file  . Number of children: Not on file  . Years of education: Not on file  . Highest education level: Bachelor's degree (e.g., BA, AB, BS)  Social Needs  . Financial resource strain: Not on file  . Food insecurity - worry: Not on file  . Food insecurity - inability: Not on file  . Transportation needs - medical: Not on file  . Transportation needs - non-medical: Not on file  Occupational History  . Occupation: Self Employed  Tobacco Use  . Smoking status: Former Smoker    Packs/day: 0.50    Years: 12.00    Pack years: 6.00    Types: Cigarettes  . Smokeless tobacco: Never Used  Substance and Sexual Activity  . Alcohol use: Yes    Alcohol/week: 0.0 - 0.6 oz    Frequency: Never  . Drug use: No  . Sexual activity: Yes    Partners: Male    Birth control/protection: Surgical    Comment: Tubal Ligaton   Other Topics Concern  . Not on file  Social History Narrative   She and her husband Clair Gulling) recently moved from [facility] down to [place] for better job opportunities. 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 She is now ready to set herself back up working.      - second marriage. No children. Her current husband Clair Gulling) has children and grandchildren.Granddaughter just diagnosed with cancer   She is starting to try to go out and exercise with her husband walking on it routinely.    Review of Systems  Gastrointestinal: Positive for constipation.  All other systems reviewed and are negative.   PHYSICAL EXAMINATION:    BP (!) 150/84 (BP Location: Right Arm, Patient Position: Sitting, Cuff Size: Normal)   Pulse 72  Resp 16   Wt  186 lb (84.4 kg)   LMP 02/15/2018   BMI 31.43 kg/m     General appearance: alert, cooperative and appears stated age Neck: no adenopathy, supple, symmetrical, trachea midline and thyroid normal to inspection and palpation Heart: regular rate and rhythm Lungs: CTAB Abdomen: soft, non-tender; bowel sounds normal; no masses,  no organomegaly Extremities: normal, atraumatic, no cyanosis Skin: normal color, texture and turgor, no rashes or lesions Lymph: normal cervical supraclavicular and inguinal nodes Neurologic: grossly normal   Pelvic: External genitalia:  no lesions              Urethra:  normal appearing urethra with no masses, tenderness or lesions              Bartholins and Skenes: normal                 Vagina: normal appearing vagina with normal color and discharge, no lesions              Cervix:without lesions  Sonohysterogram The procedure and risks of the procedure were reviewed with the patient, consent form was signed. A speculum was placed in the vagina and the cervix was cleansed with betadine. The sonohysterogram catheter was inserted into the uterine cavity without difficulty. Saline was infused under direct observation with the ultrasound. An intracavitary defect was noted c/w an endometrial polyp.The catheter was removed.    Chaperone was present for exam.  ASSESSMENT AUB on HRT, suspect menopausal Intracavitary defect noted on ultrasound, c/w endometrial polyp   PLAN Plan: hysteroscopy, polypectomy, dilation and curettage. Reviewed risks, including: bleeding, infection, uterine perforation, fluid overload, need for further sugery    An After Visit Summary was printed and given to the patient.  ~15 minutes face to face time of which over 50% was spent in counseling.

## 2018-03-25 NOTE — Telephone Encounter (Signed)
I have been communicating with the patient via [place].

## 2018-03-25 NOTE — Telephone Encounter (Signed)
My Chart message sent to patient advising confirmation of receipt of PCP clearance.   Routing to provider for final review.  Will close encounter.

## 2018-03-29 ENCOUNTER — Other Ambulatory Visit: Payer: Self-pay

## 2018-03-29 ENCOUNTER — Encounter (HOSPITAL_BASED_OUTPATIENT_CLINIC_OR_DEPARTMENT_OTHER): Payer: Self-pay | Admitting: *Deleted

## 2018-03-29 NOTE — Progress Notes (Signed)
SPOKE W/ PT VIA PHONE FOR PRE-OP INTERVIEW.  NPO AFTER MN.  ARRIVE AT 0530.  NEEDS ISTAT 8 AND URINE PREG.  WILL TAKE ESTRADIAL AM DOS W/ SIPS OF WATER AND IF NEEDED TAKE KLONOPIN.

## 2018-04-05 ENCOUNTER — Encounter (HOSPITAL_BASED_OUTPATIENT_CLINIC_OR_DEPARTMENT_OTHER): Payer: Self-pay

## 2018-04-05 ENCOUNTER — Ambulatory Visit (HOSPITAL_BASED_OUTPATIENT_CLINIC_OR_DEPARTMENT_OTHER): Payer: BLUE CROSS/BLUE SHIELD | Admitting: Anesthesiology

## 2018-04-05 ENCOUNTER — Ambulatory Visit (HOSPITAL_BASED_OUTPATIENT_CLINIC_OR_DEPARTMENT_OTHER)
Admission: RE | Admit: 2018-04-05 | Discharge: 2018-04-05 | Disposition: A | Discharge status: Home / Self Care | Payer: BLUE CROSS/BLUE SHIELD | Source: Ambulatory Visit | Attending: Obstetrics and Gynecology | Admitting: Obstetrics and Gynecology

## 2018-04-05 ENCOUNTER — Encounter (HOSPITAL_BASED_OUTPATIENT_CLINIC_OR_DEPARTMENT_OTHER)
Admission: RE | Disposition: A | Discharge status: Home / Self Care | Payer: Self-pay | Source: Ambulatory Visit | Attending: Obstetrics and Gynecology

## 2018-04-05 DIAGNOSIS — Z87891 Personal history of nicotine dependence: Secondary | ICD-10-CM | POA: Insufficient documentation

## 2018-04-05 DIAGNOSIS — Z79890 Hormone replacement therapy (postmenopausal): Secondary | ICD-10-CM | POA: Diagnosis not present

## 2018-04-05 DIAGNOSIS — I10 Essential (primary) hypertension: Secondary | ICD-10-CM | POA: Insufficient documentation

## 2018-04-05 DIAGNOSIS — K219 Gastro-esophageal reflux disease without esophagitis: Secondary | ICD-10-CM | POA: Insufficient documentation

## 2018-04-05 DIAGNOSIS — E785 Hyperlipidemia, unspecified: Secondary | ICD-10-CM | POA: Diagnosis not present

## 2018-04-05 DIAGNOSIS — Z79899 Other long term (current) drug therapy: Secondary | ICD-10-CM | POA: Insufficient documentation

## 2018-04-05 DIAGNOSIS — G2581 Restless legs syndrome: Secondary | ICD-10-CM | POA: Diagnosis not present

## 2018-04-05 DIAGNOSIS — E7800 Pure hypercholesterolemia, unspecified: Secondary | ICD-10-CM | POA: Insufficient documentation

## 2018-04-05 DIAGNOSIS — Z8249 Family history of ischemic heart disease and other diseases of the circulatory system: Secondary | ICD-10-CM | POA: Insufficient documentation

## 2018-04-05 DIAGNOSIS — F419 Anxiety disorder, unspecified: Secondary | ICD-10-CM | POA: Diagnosis not present

## 2018-04-05 DIAGNOSIS — Z780 Asymptomatic menopausal state: Secondary | ICD-10-CM | POA: Insufficient documentation

## 2018-04-05 DIAGNOSIS — N950 Postmenopausal bleeding: Secondary | ICD-10-CM | POA: Diagnosis present

## 2018-04-05 DIAGNOSIS — Z7982 Long term (current) use of aspirin: Secondary | ICD-10-CM | POA: Diagnosis not present

## 2018-04-05 DIAGNOSIS — N840 Polyp of corpus uteri: Secondary | ICD-10-CM | POA: Insufficient documentation

## 2018-04-05 HISTORY — DX: Gastro-esophageal reflux disease without esophagitis: K21.9

## 2018-04-05 HISTORY — DX: Other specified postprocedural states: Z98.890

## 2018-04-05 HISTORY — DX: Ventricular premature depolarization: I49.3

## 2018-04-05 HISTORY — DX: Allergic rhinitis, unspecified: J30.9

## 2018-04-05 HISTORY — DX: Diaphragmatic hernia without obstruction or gangrene: K44.9

## 2018-04-05 HISTORY — DX: Personal history of other malignant neoplasm of skin: Z85.828

## 2018-04-05 HISTORY — DX: Presence of spectacles and contact lenses: Z97.3

## 2018-04-05 HISTORY — DX: Unspecified osteoarthritis, unspecified site: M19.90

## 2018-04-05 HISTORY — PX: DILATATION & CURETTAGE/HYSTEROSCOPY WITH MYOSURE: SHX6511

## 2018-04-05 HISTORY — DX: Hyperlipidemia, unspecified: E78.5

## 2018-04-05 HISTORY — DX: Other constipation: K59.09

## 2018-04-05 HISTORY — DX: Other seasonal allergic rhinitis: J30.2

## 2018-04-05 LAB — POCT I-STAT, CHEM 8
BUN: 17 mg/dL (ref 6–20)
CALCIUM ION: 1.24 mmol/L (ref 1.15–1.40)
Chloride: 106 mmol/L (ref 101–111)
Creatinine, Ser: 0.7 mg/dL (ref 0.44–1.00)
GLUCOSE: 99 mg/dL (ref 65–99)
HCT: 43 % (ref 36.0–46.0)
HEMOGLOBIN: 14.6 g/dL (ref 12.0–15.0)
POTASSIUM: 3.7 mmol/L (ref 3.5–5.1)
Sodium: 142 mmol/L (ref 135–145)
TCO2: 23 mmol/L (ref 22–32)

## 2018-04-05 LAB — POCT PREGNANCY, URINE: Preg Test, Ur: NEGATIVE

## 2018-04-05 SURGERY — DILATATION & CURETTAGE/HYSTEROSCOPY WITH MYOSURE
Anesthesia: General | Site: Vagina

## 2018-04-05 MED ORDER — ONDANSETRON HCL 4 MG/2ML IJ SOLN
INTRAMUSCULAR | Status: DC | PRN
  Administered 2018-04-05: 4 mg via INTRAVENOUS

## 2018-04-05 MED ORDER — LACTATED RINGERS IV SOLN
INTRAVENOUS | Status: DC
  Administered 2018-04-05: 06:00:00 via INTRAVENOUS
  Filled 2018-04-05: qty 1000

## 2018-04-05 MED ORDER — KETOROLAC TROMETHAMINE 30 MG/ML IJ SOLN
INTRAMUSCULAR | Status: DC | PRN
  Administered 2018-04-05: 30 mg via INTRAVENOUS

## 2018-04-05 MED ORDER — PROPOFOL 10 MG/ML IV BOLUS
INTRAVENOUS | Status: AC
  Filled 2018-04-05: qty 40

## 2018-04-05 MED ORDER — EPHEDRINE SULFATE-NACL 50-0.9 MG/10ML-% IV SOSY
PREFILLED_SYRINGE | INTRAVENOUS | Status: DC | PRN
  Administered 2018-04-05: 10 mg via INTRAVENOUS

## 2018-04-05 MED ORDER — FENTANYL CITRATE (PF) 100 MCG/2ML IJ SOLN
25.0000 ug | INTRAMUSCULAR | Status: DC | PRN
  Administered 2018-04-05: 50 ug via INTRAVENOUS
  Filled 2018-04-05: qty 1

## 2018-04-05 MED ORDER — FENTANYL CITRATE (PF) 100 MCG/2ML IJ SOLN
INTRAMUSCULAR | Status: DC | PRN
  Administered 2018-04-05 (×2): 50 ug via INTRAVENOUS

## 2018-04-05 MED ORDER — MIDAZOLAM HCL 2 MG/2ML IJ SOLN
INTRAMUSCULAR | Status: AC
  Filled 2018-04-05: qty 2

## 2018-04-05 MED ORDER — MIDAZOLAM HCL 5 MG/5ML IJ SOLN
INTRAMUSCULAR | Status: DC | PRN
  Administered 2018-04-05: 2 mg via INTRAVENOUS

## 2018-04-05 MED ORDER — FENTANYL CITRATE (PF) 100 MCG/2ML IJ SOLN
INTRAMUSCULAR | Status: AC
  Filled 2018-04-05: qty 2

## 2018-04-05 MED ORDER — PROMETHAZINE HCL 25 MG/ML IJ SOLN
6.2500 mg | INTRAMUSCULAR | Status: DC | PRN
  Filled 2018-04-05: qty 1

## 2018-04-05 MED ORDER — LIDOCAINE 2% (20 MG/ML) 5 ML SYRINGE
INTRAMUSCULAR | Status: DC | PRN
  Administered 2018-04-05: 100 mg via INTRAVENOUS

## 2018-04-05 MED ORDER — KETOROLAC TROMETHAMINE 30 MG/ML IJ SOLN
INTRAMUSCULAR | Status: AC
  Filled 2018-04-05: qty 1

## 2018-04-05 MED ORDER — PROPOFOL 10 MG/ML IV BOLUS
INTRAVENOUS | Status: DC | PRN
  Administered 2018-04-05: 200 mg via INTRAVENOUS

## 2018-04-05 MED ORDER — LIDOCAINE 2% (20 MG/ML) 5 ML SYRINGE
INTRAMUSCULAR | Status: AC
  Filled 2018-04-05: qty 5

## 2018-04-05 MED ORDER — DEXAMETHASONE SODIUM PHOSPHATE 10 MG/ML IJ SOLN
INTRAMUSCULAR | Status: AC
  Filled 2018-04-05: qty 1

## 2018-04-05 MED ORDER — SODIUM CHLORIDE 0.9 % IR SOLN
Status: DC | PRN
  Administered 2018-04-05: 3000 mL

## 2018-04-05 MED ORDER — LACTATED RINGERS IV SOLN
INTRAVENOUS | Status: DC
  Filled 2018-04-05: qty 1000

## 2018-04-05 MED ORDER — EPHEDRINE 5 MG/ML INJ
INTRAVENOUS | Status: AC
  Filled 2018-04-05: qty 10

## 2018-04-05 MED ORDER — DEXAMETHASONE SODIUM PHOSPHATE 4 MG/ML IJ SOLN
INTRAMUSCULAR | Status: DC | PRN
  Administered 2018-04-05: 10 mg via INTRAVENOUS

## 2018-04-05 MED ORDER — ARTIFICIAL TEARS OPHTHALMIC OINT
TOPICAL_OINTMENT | OPHTHALMIC | Status: AC
  Filled 2018-04-05: qty 3.5

## 2018-04-05 MED ORDER — KETOROLAC TROMETHAMINE 30 MG/ML IJ SOLN
30.0000 mg | Freq: Once | INTRAMUSCULAR | Status: DC | PRN
  Filled 2018-04-05: qty 1

## 2018-04-05 MED ORDER — ONDANSETRON HCL 4 MG/2ML IJ SOLN
INTRAMUSCULAR | Status: AC
  Filled 2018-04-05: qty 2

## 2018-04-05 SURGICAL SUPPLY — 24 items
CANISTER SUCT 3000ML PPV (MISCELLANEOUS) ×4 IMPLANT
CATH ROBINSON RED A/P 16FR (CATHETERS) IMPLANT
COUNTER NEEDLE 1200 MAGNETIC (NEEDLE) ×2 IMPLANT
DEVICE MYOSURE LITE (MISCELLANEOUS) IMPLANT
DEVICE MYOSURE REACH (MISCELLANEOUS) ×2 IMPLANT
DILATOR CANAL MILEX (MISCELLANEOUS) IMPLANT
FILTER ARTHROSCOPY CONVERTOR (FILTER) ×2 IMPLANT
GLOVE BIO SURGEON STRL SZ 6.5 (GLOVE) ×2 IMPLANT
GLOVE BIOGEL PI IND STRL 7.5 (GLOVE) ×3 IMPLANT
GLOVE BIOGEL PI INDICATOR 7.5 (GLOVE) ×3
GOWN STRL REUS W/TWL LRG LVL3 (GOWN DISPOSABLE) ×4 IMPLANT
IV NS IRRIG 3000ML ARTHROMATIC (IV SOLUTION) ×2 IMPLANT
KIT TURNOVER CYSTO (KITS) ×2 IMPLANT
MYOSURE XL FIBROID REM (MISCELLANEOUS)
PACK VAGINAL MINOR WOMEN LF (CUSTOM PROCEDURE TRAY) ×2 IMPLANT
PAD OB MATERNITY 4.3X12.25 (PERSONAL CARE ITEMS) ×2 IMPLANT
PAD PREP 24X48 CUFFED NSTRL (MISCELLANEOUS) ×2 IMPLANT
SEAL ROD LENS SCOPE MYOSURE (ABLATOR) ×2 IMPLANT
SYR 20CC LL (SYRINGE) IMPLANT
SYSTEM TISS REMOVAL MYSR XL RM (MISCELLANEOUS) IMPLANT
TOWEL OR 17X24 6PK STRL BLUE (TOWEL DISPOSABLE) ×4 IMPLANT
TUBING AQUILEX INFLOW (TUBING) ×2 IMPLANT
TUBING AQUILEX OUTFLOW (TUBING) ×2 IMPLANT
WATER STERILE IRR 500ML POUR (IV SOLUTION) IMPLANT

## 2018-04-05 NOTE — Anesthesia Preprocedure Evaluation (Signed)
Anesthesia Evaluation  Patient identified by MRN, date of birth, ID band Patient awake    Reviewed: Allergy & Precautions, NPO status , Patient's Chart, lab work & pertinent test results  Airway Mallampati: II  TM Distance: >3 FB Neck ROM: Full    Dental no notable dental hx.    Pulmonary neg pulmonary ROS, former smoker,    Pulmonary exam normal breath sounds clear to auscultation       Cardiovascular hypertension, Normal cardiovascular exam Rhythm:Regular Rate:Normal     Neuro/Psych negative neurological ROS  negative psych ROS   GI/Hepatic Neg liver ROS, GERD  ,  Endo/Other  negative endocrine ROS  Renal/GU negative Renal ROS  negative genitourinary   Musculoskeletal negative musculoskeletal ROS (+)   Abdominal   Peds negative pediatric ROS (+)  Hematology negative hematology ROS (+)   Anesthesia Other Findings   Reproductive/Obstetrics negative OB ROS                             Anesthesia Physical Anesthesia Plan  ASA: II  Anesthesia Plan: General   Post-op Pain Management:    Induction: Intravenous  PONV Risk Score and Plan: 3 and Ondansetron, Dexamethasone and Treatment may vary due to age or medical condition  Airway Management Planned: LMA  Additional Equipment:   Intra-op Plan:   Post-operative Plan: Extubation in OR  Informed Consent: I have reviewed the patients History and Physical, chart, labs and discussed the procedure including the risks, benefits and alternatives for the proposed anesthesia with the patient or authorized representative who has indicated his/her understanding and acceptance.   Dental advisory given  Plan Discussed with: CRNA and Surgeon  Anesthesia Plan Comments:         Anesthesia Quick Evaluation

## 2018-04-05 NOTE — Anesthesia Procedure Notes (Signed)
Procedure Name: LMA Insertion Date/Time: 04/05/2018 7:27 AM Performed by: Myrtie Soman, MD Pre-anesthesia Checklist: Patient identified, Emergency Drugs available, Suction available and Patient being monitored Patient Re-evaluated:Patient Re-evaluated prior to induction Oxygen Delivery Method: Circle system utilized Preoxygenation: Pre-oxygenation with 100% oxygen Induction Type: IV induction Ventilation: Mask ventilation without difficulty LMA: LMA inserted LMA Size: 4.0 Number of attempts: 1 Airway Equipment and Method: Bite block Placement Confirmation: positive ETCO2 Tube secured with: Tape Dental Injury: Teeth and Oropharynx as per pre-operative assessment

## 2018-04-05 NOTE — Anesthesia Postprocedure Evaluation (Signed)
Anesthesia Post Note  Patient: Alice Hodges  Procedure(s) Performed: DILATATION & CURETTAGE/HYSTEROSCOPY WITH MYOSURE (N/A Vagina )     Patient location during evaluation: PACU Anesthesia Type: General Level of consciousness: awake and alert Pain management: pain level controlled Vital Signs Assessment: post-procedure vital signs reviewed and stable Respiratory status: spontaneous breathing, nonlabored ventilation, respiratory function stable and patient connected to nasal cannula oxygen Cardiovascular status: blood pressure returned to baseline and stable Postop Assessment: no apparent nausea or vomiting Anesthetic complications: no    Last Vitals:  Vitals:   04/05/18 0830 04/05/18 0845  BP: 122/76 112/71  Pulse: (!) 55 (!) 54  Resp: 13 17  Temp:    SpO2: 98% 96%    Last Pain:  Vitals:   04/05/18 0830  TempSrc:   PainSc: 1                  Lestat Golob S

## 2018-04-05 NOTE — Transfer of Care (Signed)
  Last Vitals:  Vitals Value Taken Time  BP    Temp    Pulse 66 04/05/2018  8:01 AM  Resp 7 04/05/2018  8:01 AM  SpO2 99 % 04/05/2018  8:01 AM  Vitals shown include unvalidated device data.  Last Pain:  Vitals:   04/05/18 0610  TempSrc:   PainSc: 0-No pain      Patients Stated Pain Goal: 3 (04/05/18 0350)  Immediate Anesthesia Transfer of Care Note  Patient: Alice Hodges  Procedure(s) Performed: Procedure(s) (LRB): DILATATION & CURETTAGE/HYSTEROSCOPY WITH MYOSURE (N/A)  Patient Location: PACU  Anesthesia Type: General  Level of Consciousness: awake, alert  and oriented  Airway & Oxygen Therapy: Patient Spontanous Breathing and Patient connected to nasal cannula oxygen  Post-op Assessment: Report given to PACU RN and Post -op Vital signs reviewed and stable  Post vital signs: Reviewed and stable  Complications: No apparent anesthesia complications

## 2018-04-05 NOTE — Discharge Instructions (Signed)

## 2018-04-05 NOTE — Interval H&P Note (Signed)
History and Physical Interval Note:  04/05/2018 7:12 AM  Alice Hodges  has presented today for surgery, with the diagnosis of AUB, endometrial polyp  The various methods of treatment have been discussed with the patient and family. After consideration of risks, benefits and other options for treatment, the patient has consented to  Procedure(s): [place] (N/A) as a surgical intervention .  The patient's history has been reviewed, patient examined, no change in status, stable for surgery.  I have reviewed the patient's chart and labs.  Questions were answered to the patient's satisfaction.     Salvadore Dom

## 2018-04-05 NOTE — Op Note (Signed)
Preoperative Diagnosis: Postmenopausal bleeding, endometrial polyp  Postoperative Diagnosis: same  Procedure: Hysteroscopy, polypectomy, dilation and curettage  Surgeon: Dr Sumner Boast  Assistants: None  Anesthesia: General via LMA  EBL: 5 cc  Fluids: 700 cc LR  Fluid deficit: 155 cc  Urine output: not recorded  Indications for surgery: The patient is a 57 yo female, who presented with postmenopausal bleeding on HRT (previously on unopposed estrogen). Work up included a sonohysterogram which showed an intracavitary defect consistent with an endometrial polyp.  The risks of the surgery were reviewed with the patient and the consent form was signed prior to her surgery.  Findings: EUA: small, anteverted, mobile uterus, no adnexal masses. Hysteroscopy: endometrial polyp, otherwise atrophic appearing endometrium. Normal tubal ostia bilaterally  Specimens: Endometrial polyp, endometrial curettings.    Procedure: The patient was taken to the operating room with an IV in place. She was placed in the dorsal lithotomy position and anesthesia was administered. She was prepped and draped in the usual sterile fashion for a vaginal procedure. She voided on the way to the OR. A weighted speculum was placed in the vagina and a single tooth tenaculum was placed on the anterior lip of the cervix. The cervix was dilated to a #7 hagar dilator. The uterus was sounded to 6 cm. The myosure hysteroscope was inserted into the uterine cavity. With continuous infusion of normal saline, the uterine cavity was visualized with the above findings. The myosure reach was used to resect the polyp. The myosure was then removed. The cavity was then curetted with the small sharp curette. The cavity had the characteristically gritty texture at the end of the procedure. The curette and the single tooth tenaculum were removed. Oozing from the tenaculum site was stopped with pressure. The speculum was removed. The patients  perineum was cleansed of betadine and she was taken out of the dorsal lithotomy position.  Upon awakening the LMA was removed and the patient was transferred to the recovery room in stable and awake condition.  The sponge and instrument count were correct. There were no complications.    CC: Dr Jerline Pain

## 2018-04-06 ENCOUNTER — Encounter (HOSPITAL_BASED_OUTPATIENT_CLINIC_OR_DEPARTMENT_OTHER): Payer: Self-pay | Admitting: Obstetrics and Gynecology

## 2018-04-07 ENCOUNTER — Telehealth: Payer: Self-pay | Admitting: *Deleted

## 2018-04-07 NOTE — Telephone Encounter (Signed)
Spoke with patient and gave results. She is doing well. She started her walking back today. -eh

## 2018-04-07 NOTE — Telephone Encounter (Signed)
-----   Message from Salvadore Dom, MD sent at 04/07/2018 10:36 AM EDT ----- Please call and let the patient know that her pathology was benign. Please check on the patient (s/p hysteroscopy, D&C on Monday) and see how she is feeling.

## 2018-04-07 NOTE — Telephone Encounter (Signed)
Patient returned call from [place]. Patient stated that she has class until 1:30pm and will not be home until 1:45pm. Asked if you could call back at that time.

## 2018-04-07 NOTE — Telephone Encounter (Signed)
Left message to call regarding lab results -eh 

## 2018-04-19 ENCOUNTER — Encounter: Payer: Self-pay | Admitting: Obstetrics and Gynecology

## 2018-04-19 ENCOUNTER — Ambulatory Visit (INDEPENDENT_AMBULATORY_CARE_PROVIDER_SITE_OTHER): Payer: BLUE CROSS/BLUE SHIELD | Admitting: Obstetrics and Gynecology

## 2018-04-19 VITALS — BP 100/76 | HR 80 | Temp 98.7°F | Resp 16 | Ht 64.5 in | Wt 186.6 lb

## 2018-04-19 DIAGNOSIS — Z9889 Other specified postprocedural states: Secondary | ICD-10-CM

## 2018-04-19 DIAGNOSIS — Z79890 Hormone replacement therapy (postmenopausal): Secondary | ICD-10-CM

## 2018-04-19 NOTE — Progress Notes (Signed)
GYNECOLOGY  VISIT   HPI: 57 y.o.   Married  Caucasian  female   [id number] with No LMP recorded. (Menstrual status: Perimenopausal).   here for post op from 04/05/18 D&C Hysteroscopy with polypectomy for PMP bleeding on HRT. Pathology with a benign polyp and inactive endometrium. She had minimally bleeding after the surgery.  The patient was previously on unopposed estrogen. Last month she was decreased to 1 mg of estrace a day from one BID. She was to continue on her daily provera. She is doing fine on the lower dose of HRT.  GYNECOLOGIC HISTORY: No LMP recorded. (Menstrual status: Perimenopausal). Contraception: tubal ligation Menopausal hormone therapy: estrace 1mg  daily, provera 2.5 mg daily.         OB History    Gravida  2   Para      Term      Preterm      AB  2   Living        SAB      TAB      Ectopic  2   Multiple      Live Births                 Patient Active Problem List   Diagnosis Date Noted  . Anxiety 02/15/2018  . Skin lesions 12/03/2017  . Restless leg syndrome 11/06/2017  . Chest pain with moderate risk for cardiac etiology 04/30/2017  . Abnormal finding on EKG 04/30/2017  . Dyslipidemia 04/30/2017  . Essential hypertension 04/30/2017    Past Medical History:  Diagnosis Date  . Abnormal uterine bleeding   . Allergic rhinitis   . Anxiety   . Chronic constipation    03-29-2018 per pt hx fecal retention  . Depression   . GERD (gastroesophageal reflux disease)   . Hiatal hernia   . History of basal cell carcinoma (BCC) excision    2016  nasal area  . Hyperlipidemia   . Hypertension   . Infertility, female   . OA (osteoarthritis)    left shoulder(spur), left hip(spur), spine  . PVC's (premature ventricular contractions)    03-29-2018  per pt since age 41s, hx holter monitor showed pvc's, per pt has never caused sob or chest pain  . Restless leg syndrome   . Seasonal allergies   . Wears glasses     Past Surgical History:  Procedure  Laterality Date  . DILATATION & CURETTAGE/HYSTEROSCOPY WITH MYOSURE N/A 04/05/2018   Procedure: DILATATION & CURETTAGE/HYSTEROSCOPY WITH MYOSURE;  Surgeon: Salvadore Dom, MD;  Location: [facility];  Service: Gynecology;  Laterality: N/A;  . DILATION AND CURETTAGE OF UTERUS  08/1996   w/ suction for missed ab  . ENDOMETRIAL ABLATION  2002   "per pt cryo endometrial ablation"  . LAPAROSCOPIC CHOLECYSTECTOMY  12/2010  . LUMBAR LAMINECTOMY  03/2000;   RE-DO  12/ 2001   L4-5  . MOHS SURGERY  2016   left side of nose   . TUBAL LIGATION  09/1999    Current Outpatient Medications  Medication Sig Dispense Refill  . aspirin EC 81 MG tablet Take 81 mg by mouth daily.    . clonazePAM (KLONOPIN) 1 MG tablet Take 1 tablet (1 mg total) by mouth 3 (three) times daily as needed for anxiety. 90 tablet 5  . estradiol (ESTRACE) 1 MG tablet Take 1 mg by mouth every morning.     . Garlic 2778 MG CAPS Take 1,000 mg by mouth daily.    Marland Kitchen  hydrochlorothiazide (HYDRODIURIL) 25 MG tablet TAKE 1 TABLET BY MOUTH EVERY DAY **PT MUST MAKE APPT**-- TAKES IN AM (FOR HYPERTENSION)    . ibuprofen (ADVIL,MOTRIN) 200 MG tablet Take 400 mg by mouth every 6 (six) hours as needed (pain).    Marland Kitchen KRILL OIL PO Take 1 capsule by mouth daily. 300 MG    . lisinopril (PRINIVIL,ZESTRIL) 10 MG tablet Take 10 mg by mouth every evening.     . loratadine (CLARITIN) 10 MG tablet Take 10 mg by mouth daily as needed for allergies.     . medroxyPROGESTERone (PROVERA) 2.5 MG tablet Take 2.5 mg by mouth every evening.     . Melatonin 10 MG CAPS Take 10 mg by mouth daily as needed.    . Multiple Vitamin (MULTIVITAMIN WITH MINERALS) TABS tablet Take 1 tablet by mouth daily.    . Multiple Vitamins-Minerals (PRESERVISION AREDS 2 PO) Take 1 capsule by mouth daily.    Marland Kitchen omeprazole (PRILOSEC) 20 MG capsule Take 20 mg by mouth every other day. Pt takes in pm    . Polyethylene Glycol 3350 (MIRALAX PO) Take by mouth 3 (three) times a  week.    . Red Yeast Rice 600 MG TABS Take 600 mg by mouth daily.     No current facility-administered medications for this visit.      ALLERGIES: Tape  Family History  Problem Relation Age of Onset  . Rheumatic fever Mother 95       Rheumatic heart disease - she died in childbirth;  . Early death Mother   . Hearing loss Mother   . Heart disease Mother   . Heart attack Mother   . Hypertension Father   . COPD Father        Also paternal uncle  . Alcohol abuse Father   . Kidney disease Father   . Polycythemia Sister   . Depression Sister   . Coronary artery disease Paternal Grandmother   . Heart attack Paternal Grandmother   . Heart disease Paternal Grandmother   . Depression Sister   . Lung cancer Paternal Uncle        As well as 3 maternal aunts  . Alcohol abuse Maternal Grandfather   . COPD Maternal Grandfather   . Diabetes Maternal Grandfather   . Stroke Maternal Grandfather   . Alcohol abuse Paternal Grandfather   . Mental illness Paternal Grandfather   . Lung cancer Maternal Aunt   . Brain cancer Maternal Aunt     Social History   Socioeconomic History  . Marital status: Married    Spouse name: Not on file  . Number of children: Not on file  . Years of education: Not on file  . Highest education level: Bachelor's degree (e.g., BA, AB, BS)  Occupational History  . Occupation: Self Employed  Social Needs  . Financial resource strain: Not on file  . Food insecurity:    Worry: Not on file    Inability: Not on file  . Transportation needs:    Medical: Not on file    Non-medical: Not on file  Tobacco Use  . Smoking status: Former Smoker    Packs/day: 0.50    Years: 15.00    Pack years: 7.50    Types: Cigarettes    Last attempt to quit: 04/28/2012    Years since quitting: 5.9  . Smokeless tobacco: Never Used  Substance and Sexual Activity  . Alcohol use: Not Currently    Frequency: Never  . Drug  use: No  . Sexual activity: Yes    Partners: Male     Birth control/protection: Surgical    Comment: Tubal Ligaton   Lifestyle  . Physical activity:    Days per week: Not on file    Minutes per session: Not on file  . Stress: Not on file  Relationships  . Social connections:    Talks on phone: Not on file    Gets together: Not on file    Attends religious service: Not on file    Active member of club or organization: Not on file    Attends meetings of clubs or organizations: Not on file    Relationship status: Not on file  . Intimate partner violence:    Fear of current or ex partner: Not on file    Emotionally abused: Not on file    Physically abused: Not on file    Forced sexual activity: Not on file  Other Topics Concern  . Not on file  Social History Narrative   She and her husband Clair Gulling) recently moved from [place] down to [place] for better job opportunities. 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 She is now ready to set herself back up working.      - second marriage. No children. Her current husband Clair Gulling) has children and grandchildren.Granddaughter just diagnosed with cancer   She is starting to try to go out and exercise with her husband walking on it routinely.    Review of Systems  Constitutional: Positive for fever.  HENT: Negative.   Eyes: Negative.   Respiratory: Negative.   Cardiovascular: Negative.   Gastrointestinal: Positive for constipation.  Genitourinary: Negative.   Musculoskeletal: Negative.   Skin: Negative.   Neurological: Negative.   Endo/Heme/Allergies: Negative.        Heat/ cold intolerance   Psychiatric/Behavioral: Negative.   low grade temp, thinks she may be coming down with something  PHYSICAL EXAMINATION:    BP 100/76 (BP Location: Left Arm, Patient Position: Sitting, Cuff Size: Large)   Pulse 80   Temp 98.7 F (37.1 C) (Oral)   Resp 16   Ht 5' 4.5"  (1.638 m)   Wt 186 lb 9.6 oz (84.6 kg)   BMI 31.54 kg/m     General appearance: alert, cooperative and appears stated age Abdomen: soft, non-tender; non distended, no masses,  no organomegaly     ASSESSMENT PMP bleeding on HRT, s/p hysteroscopy, polypectomy, D&C. Benign polyp, atrophic endometrium HRT, doing well on the lower dose of estrogen    PLAN She will cut her 1 mg estradiol pill in 1/2 and take 1/2 a tablet a day. She will continue on her provera daily Call with any concerns F/U in 6 months for an annual exam.    An After Visit Summary was printed and given to the patient.

## 2018-07-13 DIAGNOSIS — M109 Gout, unspecified: Secondary | ICD-10-CM | POA: Insufficient documentation

## 2018-07-27 ENCOUNTER — Ambulatory Visit (INDEPENDENT_AMBULATORY_CARE_PROVIDER_SITE_OTHER): Payer: BLUE CROSS/BLUE SHIELD

## 2018-07-27 ENCOUNTER — Encounter: Payer: Self-pay | Admitting: Family Medicine

## 2018-07-27 ENCOUNTER — Ambulatory Visit: Payer: BLUE CROSS/BLUE SHIELD | Admitting: Family Medicine

## 2018-07-27 VITALS — BP 124/82 | HR 71 | Temp 98.5°F | Ht 65.0 in | Wt 193.8 lb

## 2018-07-27 DIAGNOSIS — M79674 Pain in right toe(s): Secondary | ICD-10-CM

## 2018-07-27 DIAGNOSIS — I10 Essential (primary) hypertension: Secondary | ICD-10-CM

## 2018-07-27 LAB — URIC ACID: URIC ACID, SERUM: 7.9 mg/dL — AB (ref 2.4–7.0)

## 2018-07-27 MED ORDER — HYDROCHLOROTHIAZIDE 25 MG PO TABS: ORAL_TABLET | ORAL | 3 refills | Status: DC

## 2018-07-27 MED ORDER — DICLOFENAC SODIUM 75 MG PO TBEC
75.0000 mg | DELAYED_RELEASE_TABLET | Freq: Two times a day (BID) | ORAL | 0 refills | Status: DC
Start: 2018-07-27 — End: 2018-08-24

## 2018-07-27 MED ORDER — COLCHICINE 0.6 MG PO TABS
ORAL_TABLET | ORAL | 1 refills | Status: DC
Start: 2018-07-27 — End: 2018-08-24

## 2018-07-27 NOTE — Patient Instructions (Addendum)
It was very nice to see you today!  I think you can have a gout flare in your toe.  Please start colchicine.  Please take 2 pills at once, and then an additional pill 1 hour later.  Then take 1 pill twice daily until your pain resolves.  Please also take diclofenac 75 mg twice daily.  We will check blood work today as well as an Publishing rights manager.  Please let me know if your symptoms worsen or do not improve over the next few days.  Take care, Dr Jerline Pain

## 2018-07-27 NOTE — Progress Notes (Signed)
Dr Marigene Ehlers interpretation of your lab work:  Your xray showed some degenerative changes consistent with arthritis or gout. There were no fractures.  Your uric acid level is high which does suggest that your pain is coming from a gout flare. Please take the meds we discussed at your appointment. Please let me know if you would like to be started on a preventative gout medication called allopurinol. This is not something that is necessary, but may be beneficial if you start having more frequent or severe gout flares.    If you have any additional questions, please give [place] a call or send [place] a message through [place].  Take care, Dr Jerline Pain

## 2018-07-27 NOTE — Assessment & Plan Note (Signed)
At goal.  Continue lisinopril 10 mg daily.  Refill HCTZ 25 mg daily.

## 2018-07-27 NOTE — Progress Notes (Signed)
   Subjective:  Alice Hodges is a 57 y.o. female who presents today for same-day appointment with a chief complaint of great toe pain.   HPI:    Great Toe Pain, Acute problem Started about 3 weeks ago.  Stable over that time.  Located to right great toe.  Patient has been increasing her walking distance and started wearing new shoes.  Symptoms started a few days after switching shoes.  She went back to her old style shoes and symptoms did not improve.  It is gotten so severe that she has had stopped walking.  She has tried ice which helps a little bit.  She is also tried ibuprofen and Aleve which have helped a little bit.  Pain is worse with movement and walking.  She has a history of gout.  Recently had several days of seafood/shellfish intake.  No injuries.  No other obvious alleviating or aggravating factors.  Hypertension. Chronic problem, stable Currently on lisinopril 10 mg daily and HCTZ 25 mg daily.  Tolerates both these well without side effects.  Needs HCTZ refilled today.  ROS: Per HPI  PMH: She reports that she quit smoking about 6 years ago. Her smoking use included cigarettes. She has a 7.50 pack-year smoking history. She has never used smokeless tobacco. She reports that she drank alcohol. She reports that she does not use drugs.  Objective:  Physical Exam: BP 124/82 (BP Location: Left Arm, Patient Position: Sitting, Cuff Size: Normal)   Pulse 71   Temp 98.5 F (36.9 C) (Oral)   Ht 5\' 5"  (1.651 m)   Wt 193 lb 12.8 oz (87.9 kg)   SpO2 97%   BMI 32.25 kg/m   Gen: NAD, resting comfortably MSK:  -Right foot: Right first MTP joint grossly edematous and tender to palpation.  Full range of motion throughout.  Neurovascular intact distally.  Assessment/Plan:  Right great toe pain Likely secondary to gout flare.  Will check x-ray today to rule out fracture or other bony abnormality.  Also check uric acid level today.  Start colchicine and diclofenac.  Consider referral to  sports medicine if no improvement with above treatment.  Consider switching lisinopril to losartan in the future if has recurrent gout flares.  Also consider starting allopurinol -discussed this with patient, she deferred at this time.  Essential hypertension At goal.  Continue lisinopril 10 mg daily.  Refill HCTZ 25 mg daily.  Algis Greenhouse. Jerline Pain, MD 07/27/2018 8:52 AM

## 2018-08-14 ENCOUNTER — Encounter (HOSPITAL_COMMUNITY): Payer: Self-pay | Admitting: Emergency Medicine

## 2018-08-14 ENCOUNTER — Other Ambulatory Visit: Payer: Self-pay

## 2018-08-14 ENCOUNTER — Emergency Department (HOSPITAL_COMMUNITY): Payer: BLUE CROSS/BLUE SHIELD

## 2018-08-14 ENCOUNTER — Emergency Department (HOSPITAL_COMMUNITY)
Admission: EM | Admit: 2018-08-14 | Discharge: 2018-08-14 | Disposition: A | Discharge status: Home / Self Care | Payer: BLUE CROSS/BLUE SHIELD | Attending: Emergency Medicine | Admitting: Emergency Medicine

## 2018-08-14 DIAGNOSIS — Y9301 Activity, walking, marching and hiking: Secondary | ICD-10-CM | POA: Insufficient documentation

## 2018-08-14 DIAGNOSIS — J302 Other seasonal allergic rhinitis: Secondary | ICD-10-CM | POA: Diagnosis not present

## 2018-08-14 DIAGNOSIS — Z79899 Other long term (current) drug therapy: Secondary | ICD-10-CM | POA: Diagnosis not present

## 2018-08-14 DIAGNOSIS — I10 Essential (primary) hypertension: Secondary | ICD-10-CM | POA: Insufficient documentation

## 2018-08-14 DIAGNOSIS — Z7982 Long term (current) use of aspirin: Secondary | ICD-10-CM | POA: Insufficient documentation

## 2018-08-14 DIAGNOSIS — Y92480 Sidewalk as the place of occurrence of the external cause: Secondary | ICD-10-CM | POA: Diagnosis not present

## 2018-08-14 DIAGNOSIS — R51 Headache: Secondary | ICD-10-CM | POA: Diagnosis present

## 2018-08-14 DIAGNOSIS — W101XXA Fall (on)(from) sidewalk curb, initial encounter: Secondary | ICD-10-CM | POA: Insufficient documentation

## 2018-08-14 DIAGNOSIS — S01112A Laceration without foreign body of left eyelid and periocular area, initial encounter: Secondary | ICD-10-CM | POA: Insufficient documentation

## 2018-08-14 DIAGNOSIS — Y999 Unspecified external cause status: Secondary | ICD-10-CM | POA: Insufficient documentation

## 2018-08-14 DIAGNOSIS — Z23 Encounter for immunization: Secondary | ICD-10-CM | POA: Diagnosis not present

## 2018-08-14 DIAGNOSIS — Z87891 Personal history of nicotine dependence: Secondary | ICD-10-CM | POA: Insufficient documentation

## 2018-08-14 DIAGNOSIS — S0181XA Laceration without foreign body of other part of head, initial encounter: Secondary | ICD-10-CM

## 2018-08-14 DIAGNOSIS — S0083XA Contusion of other part of head, initial encounter: Secondary | ICD-10-CM

## 2018-08-14 MED ORDER — TETANUS-DIPHTH-ACELL PERTUSSIS 5-2.5-18.5 LF-MCG/0.5 IM SUSP
0.5000 mL | Freq: Once | INTRAMUSCULAR | Status: AC
  Administered 2018-08-14: 0.5 mL via INTRAMUSCULAR
  Filled 2018-08-14: qty 0.5

## 2018-08-14 MED ORDER — LIDOCAINE HCL (PF) 1 % IJ SOLN
30.0000 mL | Freq: Once | INTRAMUSCULAR | Status: AC
  Administered 2018-08-14: 30 mL
  Filled 2018-08-14: qty 30

## 2018-08-14 NOTE — Discharge Instructions (Addendum)
Treatment: Keep your wound dry and dressing applied until this time tomorrow. After 24 hours, you may wash with warm soapy water. Dry and apply antibiotic ointment and clean dressing. Do this daily until your sutures are removed.  Use ice to areas of swelling 3-4 times daily on hand 20 minutes on, 20 minutes off.  It is possible that you have a mild concussion.  I advised avoiding reading and looking at computer, tablet, phone if these activities give you a headache.  Do not attempt contact sports.  Follow-up: Please follow-up with your primary care provider or return to emergency department in 7-10 days for suture removal if they have not already fallen out. Be aware of signs of infection: fever, increasing pain, redness, swelling, drainage from the area. Please call your primary care provider or return to emergency department if you develop any of these symptoms or if any of the sutures come out prior to removal. Please return to the emergency department if you develop any other new or worsening symptoms.

## 2018-08-14 NOTE — ED Triage Notes (Signed)
Pt walking on uneven sidewalk and fell forward.  No altered level of conciousness.  Left eye has circular avulsion.  Nose has abraision.  No other painful areas.    100 mcg fentanyl given--patient BP dropped to 98/71  Allergic to adhesive tape  Last tetanus 2009  Takes aspirin daily.

## 2018-08-14 NOTE — ED Provider Notes (Signed)
[facility] EMERGENCY DEPARTMENT Provider Note   CSN: [id number] Arrival date & time: 08/14/18  2426     History   Chief Complaint Chief Complaint  Patient presents with  . Fall  . Laceration    HPI Alice Hodges is a 57 y.o. female with history of hypertension, hyperlipidemia, GERD, depression, anxiety who presents following fall.  Patient was out for a walk with her husband when she tripped on the sidewalk.  She went forward and hit her face on the sidewalk.  She put her hands out in front of her.  She has right wrist pain, laceration over her left eye, and abrasion to her nose with underlying pain.  She does not believe she lost consciousness.  She has a headache.  She denies any dizziness, lightheadedness, nausea, vomiting.  She takes a daily aspirin.  She denies any other anticoagulation.  She has some associated neck pain, but no back pain.  Patient denies any chest pain, shortness of breath, abdominal pain, numbness or tingling.  HPI  Past Medical History:  Diagnosis Date  . Abnormal uterine bleeding   . Allergic rhinitis   . Anxiety   . Chronic constipation    03-29-2018 per pt hx fecal retention  . Depression   . GERD (gastroesophageal reflux disease)   . Hiatal hernia   . History of basal cell carcinoma (BCC) excision    2016  nasal area  . Hyperlipidemia   . Hypertension   . Infertility, female   . OA (osteoarthritis)    left shoulder(spur), left hip(spur), spine  . PVC's (premature ventricular contractions)    03-29-2018  per pt since age 89s, hx holter monitor showed pvc's, per pt has never caused sob or chest pain  . Restless leg syndrome   . Seasonal allergies   . Wears glasses     Patient Active Problem List   Diagnosis Date Noted  . Anxiety 02/15/2018  . Skin lesions 12/03/2017  . Restless leg syndrome 11/06/2017  . Chest pain with moderate risk for cardiac etiology 04/30/2017  . Abnormal finding on EKG 04/30/2017  .  Dyslipidemia 04/30/2017  . Essential hypertension 04/30/2017    Past Surgical History:  Procedure Laterality Date  . DILATATION & CURETTAGE/HYSTEROSCOPY WITH MYOSURE N/A 04/05/2018   Procedure: DILATATION & CURETTAGE/HYSTEROSCOPY WITH MYOSURE;  Surgeon: Salvadore Dom, MD;  Location: [facility];  Service: Gynecology;  Laterality: N/A;  . DILATION AND CURETTAGE OF UTERUS  08/1996   w/ suction for missed ab  . ENDOMETRIAL ABLATION  2002   "per pt cryo endometrial ablation"  . LAPAROSCOPIC CHOLECYSTECTOMY  12/2010  . LUMBAR LAMINECTOMY  03/2000;   RE-DO  12/ 2001   L4-5  . LUMBAR LAMINECTOMY  2001  . MOHS SURGERY  2016   left side of nose   . TUBAL LIGATION  09/1999  . TUBAL LIGATION  01/1999     OB History    Gravida  2   Para      Term      Preterm      AB  2   Living        SAB      TAB      Ectopic  2   Multiple      Live Births               Home Medications    Prior to Admission medications   Medication Sig Start Date End Date  Taking? Authorizing Provider  aspirin EC 81 MG tablet Take 81 mg by mouth daily.   Yes [provider]  clonazePAM (KLONOPIN) 1 MG tablet Take 1 tablet (1 mg total) by mouth 3 (three) times daily as needed for anxiety. 02/15/18  Yes Vivi Barrack, MD  colchicine 0.6 MG tablet Take 2 pills at the first sign of flare, then 1 pill 1 hour later.  Then 1 pill bid until pain resolves. 07/27/18  Yes Vivi Barrack, MD  diclofenac (VOLTAREN) 75 MG EC tablet Take 1 tablet (75 mg total) by mouth 2 (two) times daily. 07/27/18  Yes Vivi Barrack, MD  estradiol (ESTRACE) 1 MG tablet Take 1 mg by mouth every morning.    Yes [provider]  Garlic 1657 MG CAPS Take 1,000 mg by mouth daily.   Yes [provider]  hydrochlorothiazide (HYDRODIURIL) 25 MG tablet TAKE 1 TABLET BY MOUTH EVERY DAY 07/27/18  Yes Vivi Barrack, MD  ibuprofen (ADVIL,MOTRIN) 200 MG tablet Take 400 mg by mouth every 6  (six) hours as needed (pain).   Yes [provider]  KRILL OIL PO Take 1 capsule by mouth daily. 300 MG   Yes [provider]  lisinopril (PRINIVIL,ZESTRIL) 10 MG tablet Take 10 mg by mouth every evening.  04/13/17  Yes [provider]  loratadine (CLARITIN) 10 MG tablet Take 10 mg by mouth daily as needed for allergies.    Yes [provider]  medroxyPROGESTERone (PROVERA) 2.5 MG tablet Take 2.5 mg by mouth every evening.  10/15/17  Yes [provider]  Melatonin 10 MG CAPS Take 10 mg by mouth daily as needed.   Yes [provider]  Multiple Vitamin (MULTIVITAMIN WITH MINERALS) TABS tablet Take 1 tablet by mouth daily.   Yes [provider]  Multiple Vitamins-Minerals (PRESERVISION AREDS 2 PO) Take 1 capsule by mouth daily.   Yes [provider]  omeprazole (PRILOSEC) 20 MG capsule Take 20 mg by mouth every other day. Pt takes in pm   Yes [provider]  Polyethylene Glycol 3350 (MIRALAX PO) Take by mouth 3 (three) times a week.   Yes [provider]  Red Yeast Rice 600 MG TABS Take 600 mg by mouth daily.   Yes [provider]    Family History Family History  Problem Relation Age of Onset  . Rheumatic fever Mother 42       Rheumatic heart disease - she died in childbirth;  . Early death Mother   . Hearing loss Mother   . Heart disease Mother   . Heart attack Mother   . Hypertension Father   . COPD Father        Also paternal uncle  . Alcohol abuse Father   . Kidney disease Father   . Polycythemia Sister   . Depression Sister   . Coronary artery disease Paternal Grandmother   . Heart attack Paternal Grandmother   . Heart disease Paternal Grandmother   . Depression Sister   . Lung cancer Paternal Uncle        As well as 3 maternal aunts  . Alcohol abuse Maternal Grandfather   . COPD Maternal Grandfather   . Diabetes Maternal Grandfather   . Stroke Maternal Grandfather   . Alcohol  abuse Paternal Grandfather   . Mental illness Paternal Grandfather   . Lung cancer Maternal Aunt   . Brain cancer Maternal Aunt     Social History Social History  Tobacco Use  . Smoking status: Former Smoker    Packs/day: 0.50    Years: 15.00    Pack years: 7.50    Types: Cigarettes    Last attempt to quit: 04/28/2012    Years since quitting: 6.2  . Smokeless tobacco: Never Used  Substance Use Topics  . Alcohol use: Not Currently    Frequency: Never  . Drug use: No     Allergies   Tape   Review of Systems Review of Systems  Constitutional: Negative for chills and fever.  HENT: Positive for facial swelling. Negative for sore throat.   Respiratory: Negative for shortness of breath.   Cardiovascular: Negative for chest pain.  Gastrointestinal: Negative for abdominal pain, nausea and vomiting.  Genitourinary: Negative for dysuria.  Musculoskeletal: Positive for neck pain. Negative for back pain.  Skin: Positive for wound. Negative for rash.  Neurological: Positive for headaches. Negative for syncope.  Psychiatric/Behavioral: The patient is not nervous/anxious.      Physical Exam Updated Vital Signs BP (!) 100/58 (BP Location: Right Arm)   Pulse (!) 56   Temp 98.1 F (36.7 C) (Oral)   Resp 12   Ht 5\' 4"  (1.626 m)   Wt 86.2 kg   SpO2 100%   BMI 32.61 kg/m   Physical Exam  Constitutional: She appears well-developed and well-nourished. No distress.  HENT:  Head: Normocephalic and atraumatic.    Mouth/Throat: Oropharynx is clear and moist. No oropharyngeal exudate.  Eyes: Pupils are equal, round, and reactive to light. Conjunctivae and EOM are normal. Right eye exhibits no discharge. Left eye exhibits no discharge. No scleral icterus.  Neck: Normal range of motion. Neck supple. No thyromegaly present.  Cardiovascular: Normal rate, regular rhythm, normal heart sounds and intact distal pulses. Exam reveals no gallop and no friction rub.  No murmur  heard. Pulmonary/Chest: Effort normal and breath sounds normal. No stridor. No respiratory distress. She has no wheezes. She has no rales.  Abdominal: Soft. Bowel sounds are normal. She exhibits no distension. There is no tenderness. There is no rebound and no guarding.  Musculoskeletal: She exhibits no edema.  R dorsal wrist tenderness with overlying abrasion, no anatomical snuffbox tenderness No L wrist tenderness Mild midline cervical tenderness, no thoracic or lumbar tenderness  Lymphadenopathy:    She has no cervical adenopathy.  Neurological: She is alert. Coordination normal.  CN 3-12 intact; normal sensation throughout; 5/5 strength in all 4 extremities; equal bilateral grip strength  Skin: Skin is warm and dry. No rash noted. She is not diaphoretic. No pallor.  Psychiatric: She has a normal mood and affect.  Nursing note and vitals reviewed.    ED Treatments / Results  Labs (all labs ordered are listed, but only abnormal results are displayed) Labs Reviewed - No data to display  EKG None  Radiology Dg Wrist Complete Right  Result Date: 08/14/2018 CLINICAL DATA:  Recent fall EXAM: RIGHT WRIST - COMPLETE 3+ VIEW COMPARISON:  None. FINDINGS: There is no evidence of fracture or dislocation. There is no evidence of arthropathy or other focal bone abnormality. Soft tissues are unremarkable. IMPRESSION: No acute abnormality noted. Electronically Signed   By: Inez Catalina M.D.   On: 08/14/2018 09:16   Ct Head Wo Contrast  Result Date: 08/14/2018 CLINICAL DATA:  Fall this morning stepping off curb injuring head and face with laceration above left eye and abrasion to nose. EXAM: CT HEAD WITHOUT CONTRAST CT MAXILLOFACIAL WITHOUT CONTRAST CT CERVICAL SPINE WITHOUT CONTRAST TECHNIQUE:  Multidetector CT imaging of the head, cervical spine, and maxillofacial structures were performed using the standard protocol without intravenous contrast. Multiplanar CT image reconstructions of the  cervical spine and maxillofacial structures were also generated. COMPARISON:  None. FINDINGS: CT HEAD FINDINGS Brain: No evidence of acute infarction, hemorrhage, hydrocephalus, extra-axial collection or mass lesion/mass effect. Vascular: No hyperdense vessel or unexpected calcification. Skull: Normal. Negative for fracture or focal lesion. Other: Soft tissue laceration over the left supraorbital region. CT MAXILLOFACIAL FINDINGS Osseous: No acute fracture. Orbits: Globes are normal and symmetric. Retrobulbar spaces are normal. Sinuses: Paranasal sinuses are well developed and well aerated without air-fluid level or significant opacification. Mastoid air cells are clear. Mild deviation of the nasal septum to the right. Ostiomeatal complexes are patent. Concha bullosa left middle turbinate. Soft tissues: Mild left periorbital soft tissue swelling. Left supraorbital soft tissue laceration. Mild soft tissue swelling over the nasal bridge. CT CERVICAL SPINE FINDINGS Alignment: Straightening of the normal cervical lordosis. Skull base and vertebrae: Vertebral body heights are maintained. There is mild spondylosis of the cervical spine. Atlantoaxial articulation is normal. Moderate uncovertebral joint spurring is present over the lower cervical spine. Left-sided neural from narrowing at the C5-6 level and right-sided neural foraminal narrowing at the C6-7 level. No acute fracture or subluxation. Soft tissues and spinal canal: No prevertebral fluid or swelling. No visible canal hematoma. Disc levels:  Disc space narrowing at the C5-6 and C6-7 levels. Upper chest: No acute findings. Other: 6 mm hypodense nodule over the right lobe of the thyroid. IMPRESSION: No acute brain injury. No acute facial bone fracture. No acute cervical spine injury. Left periorbital soft tissue swelling with left supraorbital soft tissue laceration. Soft tissue swelling over the nasal bridge. Mild spondylosis of the cervical spine with disc  disease at the C5-6 and C6-7 levels. Neural foraminal narrowing as described at the same levels. 6 mm right thyroid nodule. Electronically Signed   By: Marin Olp M.D.   On: 08/14/2018 09:18   Ct Cervical Spine Wo Contrast  Result Date: 08/14/2018 CLINICAL DATA:  Fall this morning stepping off curb injuring head and face with laceration above left eye and abrasion to nose. EXAM: CT HEAD WITHOUT CONTRAST CT MAXILLOFACIAL WITHOUT CONTRAST CT CERVICAL SPINE WITHOUT CONTRAST TECHNIQUE: Multidetector CT imaging of the head, cervical spine, and maxillofacial structures were performed using the standard protocol without intravenous contrast. Multiplanar CT image reconstructions of the cervical spine and maxillofacial structures were also generated. COMPARISON:  None. FINDINGS: CT HEAD FINDINGS Brain: No evidence of acute infarction, hemorrhage, hydrocephalus, extra-axial collection or mass lesion/mass effect. Vascular: No hyperdense vessel or unexpected calcification. Skull: Normal. Negative for fracture or focal lesion. Other: Soft tissue laceration over the left supraorbital region. CT MAXILLOFACIAL FINDINGS Osseous: No acute fracture. Orbits: Globes are normal and symmetric. Retrobulbar spaces are normal. Sinuses: Paranasal sinuses are well developed and well aerated without air-fluid level or significant opacification. Mastoid air cells are clear. Mild deviation of the nasal septum to the right. Ostiomeatal complexes are patent. Concha bullosa left middle turbinate. Soft tissues: Mild left periorbital soft tissue swelling. Left supraorbital soft tissue laceration. Mild soft tissue swelling over the nasal bridge. CT CERVICAL SPINE FINDINGS Alignment: Straightening of the normal cervical lordosis. Skull base and vertebrae: Vertebral body heights are maintained. There is mild spondylosis of the cervical spine. Atlantoaxial articulation is normal. Moderate uncovertebral joint spurring is present over the lower  cervical spine. Left-sided neural from narrowing at the C5-6 level and right-sided neural foraminal  narrowing at the C6-7 level. No acute fracture or subluxation. Soft tissues and spinal canal: No prevertebral fluid or swelling. No visible canal hematoma. Disc levels:  Disc space narrowing at the C5-6 and C6-7 levels. Upper chest: No acute findings. Other: 6 mm hypodense nodule over the right lobe of the thyroid. IMPRESSION: No acute brain injury. No acute facial bone fracture. No acute cervical spine injury. Left periorbital soft tissue swelling with left supraorbital soft tissue laceration. Soft tissue swelling over the nasal bridge. Mild spondylosis of the cervical spine with disc disease at the C5-6 and C6-7 levels. Neural foraminal narrowing as described at the same levels. 6 mm right thyroid nodule. Electronically Signed   By: Marin Olp M.D.   On: 08/14/2018 09:18   Ct Maxillofacial Wo Contrast  Result Date: 08/14/2018 CLINICAL DATA:  Fall this morning stepping off curb injuring head and face with laceration above left eye and abrasion to nose. EXAM: CT HEAD WITHOUT CONTRAST CT MAXILLOFACIAL WITHOUT CONTRAST CT CERVICAL SPINE WITHOUT CONTRAST TECHNIQUE: Multidetector CT imaging of the head, cervical spine, and maxillofacial structures were performed using the standard protocol without intravenous contrast. Multiplanar CT image reconstructions of the cervical spine and maxillofacial structures were also generated. COMPARISON:  None. FINDINGS: CT HEAD FINDINGS Brain: No evidence of acute infarction, hemorrhage, hydrocephalus, extra-axial collection or mass lesion/mass effect. Vascular: No hyperdense vessel or unexpected calcification. Skull: Normal. Negative for fracture or focal lesion. Other: Soft tissue laceration over the left supraorbital region. CT MAXILLOFACIAL FINDINGS Osseous: No acute fracture. Orbits: Globes are normal and symmetric. Retrobulbar spaces are normal. Sinuses: Paranasal sinuses  are well developed and well aerated without air-fluid level or significant opacification. Mastoid air cells are clear. Mild deviation of the nasal septum to the right. Ostiomeatal complexes are patent. Concha bullosa left middle turbinate. Soft tissues: Mild left periorbital soft tissue swelling. Left supraorbital soft tissue laceration. Mild soft tissue swelling over the nasal bridge. CT CERVICAL SPINE FINDINGS Alignment: Straightening of the normal cervical lordosis. Skull base and vertebrae: Vertebral body heights are maintained. There is mild spondylosis of the cervical spine. Atlantoaxial articulation is normal. Moderate uncovertebral joint spurring is present over the lower cervical spine. Left-sided neural from narrowing at the C5-6 level and right-sided neural foraminal narrowing at the C6-7 level. No acute fracture or subluxation. Soft tissues and spinal canal: No prevertebral fluid or swelling. No visible canal hematoma. Disc levels:  Disc space narrowing at the C5-6 and C6-7 levels. Upper chest: No acute findings. Other: 6 mm hypodense nodule over the right lobe of the thyroid. IMPRESSION: No acute brain injury. No acute facial bone fracture. No acute cervical spine injury. Left periorbital soft tissue swelling with left supraorbital soft tissue laceration. Soft tissue swelling over the nasal bridge. Mild spondylosis of the cervical spine with disc disease at the C5-6 and C6-7 levels. Neural foraminal narrowing as described at the same levels. 6 mm right thyroid nodule. Electronically Signed   By: Marin Olp M.D.   On: 08/14/2018 09:18    Procedures .Marland KitchenLaceration Repair Date/Time: 08/14/2018 2:01 PM Performed by: Frederica Kuster, PA-C Authorized by: Frederica Kuster, PA-C   Consent:    Consent obtained:  Verbal   Consent given by:  Patient   Risks discussed:  Infection, pain, poor cosmetic result and need for additional repair   Alternatives discussed:  No treatment Anesthesia (see MAR for  exact dosages):    Anesthesia method:  Local infiltration   Local anesthetic:  Lidocaine 1% w/o  epi Laceration details:    Location:  Face   Face location:  L eyebrow   Length (cm):  3.5   Depth (mm):  5 Repair type:    Repair type:  Intermediate Pre-procedure details:    Preparation:  Patient was prepped and draped in usual sterile fashion and imaging obtained to evaluate for foreign bodies Exploration:    Wound extent: no foreign bodies/material noted and no muscle damage noted     Contaminated: no   Treatment:    Area cleansed with:  Saline   Amount of cleaning:  Standard   Irrigation solution:  Sterile saline   Irrigation volume:  100   Irrigation method:  Syringe   Visualized foreign bodies/material removed: no   Skin repair:    Repair method:  Sutures   Suture size:  5-0   Wound skin closure material used: Vicryl Rapide.   Suture technique:  Simple interrupted   Number of sutures:  11 Approximation:    Approximation:  Close Post-procedure details:    Dressing:  Antibiotic ointment and non-adherent dressing   Patient tolerance of procedure:  Tolerated well, no immediate complications   (including critical care time)  Medications Ordered in ED Medications  Tdap (BOOSTRIX) injection 0.5 mL (0.5 mLs Intramuscular Given 08/14/18 0817)  lidocaine (PF) (XYLOCAINE) 1 % injection 30 mL (30 mLs Infiltration Given by Other 08/14/18 2130)     Initial Impression / Assessment and Plan / ED Course  I have reviewed the triage vital signs and the nursing notes.  Pertinent labs & imaging results that were available during my care of the patient were reviewed by me and considered in my medical decision making (see chart for details).  Clinical Course as of Aug 14 1404  Sat Aug 14, 8268  [id number] 57 year old female with mechanical fall striking her forehead and nose.  She had CAT scan and max face that did not show any obvious fractures or bleed.  She is a laceration stellate on her  forehead that is going to require suture closure.  Only she will be discharged to follow-up with her PCP.   [MB]    Clinical Course User Index [MB] Hayden Rasmussen, MD    Patient with stellate laceration to left eyebrow repaired by myself, see procedure note.  Tetanus updated in the ED.  CT of the head, C-spine, maxillofacial are negative for fracture.  The C-spine showed a 6 mm thyroid nodule which I made patient aware to talk to her doctor about.  Her right wrist x-ray is negative and her pain is improved in the wrist and has no tenderness on repeat exam.  Low suspicion of occult scaphoid fracture.  Patient advised to follow-up with PCP or return if sutures have not fallen out in 7 days.  She is advised reasons to return sooner including signs of infection.  She understands and agrees with plan.  Patient vitals stable throughout ED course and discharged in satisfactory condition.  Final Clinical Impressions(s) / ED Diagnoses   Final diagnoses:  Facial laceration, initial encounter  Contusion of face, initial encounter    ED Discharge Orders    None       Frederica Kuster, PA-C 08/14/18 1405    Hayden Rasmussen, MD 08/14/18 Bosie Helper

## 2018-08-16 ENCOUNTER — Encounter: Payer: Self-pay | Admitting: Family Medicine

## 2018-08-24 ENCOUNTER — Ambulatory Visit: Payer: BLUE CROSS/BLUE SHIELD | Admitting: Family Medicine

## 2018-08-24 ENCOUNTER — Encounter: Payer: Self-pay | Admitting: Family Medicine

## 2018-08-24 VITALS — BP 130/78 | HR 70 | Temp 98.3°F | Ht 65.0 in | Wt 196.2 lb

## 2018-08-24 DIAGNOSIS — F419 Anxiety disorder, unspecified: Secondary | ICD-10-CM | POA: Diagnosis not present

## 2018-08-24 DIAGNOSIS — E041 Nontoxic single thyroid nodule: Secondary | ICD-10-CM | POA: Insufficient documentation

## 2018-08-24 DIAGNOSIS — S0181XD Laceration without foreign body of other part of head, subsequent encounter: Secondary | ICD-10-CM

## 2018-08-24 DIAGNOSIS — Z23 Encounter for immunization: Secondary | ICD-10-CM

## 2018-08-24 MED ORDER — CLONAZEPAM 1 MG PO TABS: 1.0000 mg | ORAL_TABLET | Freq: Three times a day (TID) | ORAL | 5 refills | Status: DC | PRN

## 2018-08-24 NOTE — Patient Instructions (Addendum)
It was very nice to see you today!  Your wound should continue healing normally.  Please let me know if you have any signs of infection.  I will refill your clonapin today.  Please come back on or after 11/06/2018 for your physical with blood work, or sooner as needed.  Take care, Dr Jerline Pain  Suture Removal, Care After Refer to this sheet in the next few weeks. These instructions provide you with information on caring for yourself after your procedure. Your health care provider may also give you more specific instructions. Your treatment has been planned according to current medical practices, but problems sometimes occur. Call your health care provider if you have any problems or questions after your procedure. What can I expect after the procedure? After your stitches (sutures) are removed, it is typical to have the following:  Some discomfort and swelling in the wound area.  Slight redness in the area.  Follow these instructions at home:  If you have skin adhesive strips over the wound area, do not take the strips off. They will fall off on their own in a few days. If the strips remain in place after 14 days, you may remove them.  Change any bandages (dressings) at least once a day or as directed by your health care provider. If the bandage sticks, soak it off with warm, soapy water.  Apply cream or ointment only as directed by your health care provider. If using cream or ointment, wash the area with soap and water 2 times a day to remove all the cream or ointment. Rinse off the soap and pat the area dry with a clean towel.  Keep the wound area dry and clean. If the bandage becomes wet or dirty, or if it develops a bad smell, change it as soon as possible.  Continue to protect the wound from injury.  Use sunscreen when out in the sun. New scars become sunburned easily. Contact a health care provider if:  You have increasing redness, swelling, or pain in the wound.  You see pus  coming from the wound.  You have a fever.  You notice a bad smell coming from the wound or dressing.  Your wound breaks open (edges not staying together). This information is not intended to replace advice given to you by your health care provider. Make sure you discuss any questions you have with your health care provider. Document Released: 09/09/2001 Document Revised: 05/22/2016 Document Reviewed: 07/27/2013 Elsevier Interactive Patient Education  2017 [employer].

## 2018-08-24 NOTE — Progress Notes (Signed)
   Subjective:  Alice Hodges is a 57 y.o. female who presents today with a chief complaint of facial laceration follow up.   HPI:  Facial Laceration, new problem Patient unfortunately suffered facial laceration above left eyebrow approximately 10 days ago after suffering a mechanical fall and falling onto the sidewalk.  She went to the emergency department and 11 stitches placed.  She has done very well over the last several days.  She has had a little bit of pain to the area but no other major complications.  Anxiety, established problem, worsening Patient's anxiety has worsened since her fall.  She has been taking Klonopin 2-3 times daily which helps. Needs refill today.  No reported SI or HI.  ROS: Per HPI  PMH: She reports that she quit smoking about 6 years ago. Her smoking use included cigarettes. She has a 7.50 pack-year smoking history. She has never used smokeless tobacco. She reports that she drank alcohol. She reports that she does not use drugs.  Objective:  Physical Exam: BP 130/78 (BP Location: Left Arm, Patient Position: Sitting, Cuff Size: Normal)   Pulse 70   Temp 98.3 F (36.8 C) (Oral)   Ht 5\' 5"  (1.651 m)   Wt 196 lb 3.2 oz (89 kg)   SpO2 98%   BMI 32.65 kg/m   Gen: NAD, resting comfortably HEENT: Stellate lesion above left eyebrow well-healed. CV: RRR with no murmurs appreciated Pulm: NWOB, CTAB with no crackles, wheezes, or rhonchi GI: Normal bowel sounds present. Soft, Nontender, Nondistended. MSK: No edema, cyanosis, or clubbing noted Skin: Warm, dry Neuro: Grossly normal, moves all extremities Psych: Normal affect and thought content  Procedure note: Patient gave verbal consent for suture removal.  11 simple interrupted sutures were removed without complication.  No blood loss.  Assessment/Plan:  Thyroid nodule Incidentally found on her CT scan.  The emergency room.  Per her CT scan from 2018 this does not need further work-up.  Anxiety We will  refill her clonazepam today.Symptoms will improve as she recovers from her fall.  Discussed reasons to return to care.  Consider starting controller SSRI if continues to need higher doses of clonazepam.  Facial laceration Sutures removed today.  See above procedure note.  Gave home care instructions.  Return as needed.  Algis Greenhouse. Jerline Pain, MD 08/24/2018 4:56 PM

## 2018-08-24 NOTE — Assessment & Plan Note (Signed)
Incidentally found on her CT scan.  The emergency room.  Per her CT scan from 2018 this does not need further work-up.

## 2018-08-24 NOTE — Assessment & Plan Note (Signed)
We will refill her clonazepam today.Symptoms will improve as she recovers from her fall.  Discussed reasons to return to care.  Consider starting controller SSRI if continues to need higher doses of clonazepam.

## 2018-08-24 NOTE — Addendum Note (Signed)
Addended by: Elmer Bales on: 08/24/2018 05:10 PM   Modules accepted: Orders

## 2018-08-31 ENCOUNTER — Ambulatory Visit
Admission: RE | Admit: 2018-08-31 | Discharge: 2018-08-31 | Disposition: A | Discharge status: Home / Self Care | Payer: BLUE CROSS/BLUE SHIELD | Source: Ambulatory Visit | Attending: Obstetrics and Gynecology | Admitting: Obstetrics and Gynecology

## 2018-08-31 ENCOUNTER — Other Ambulatory Visit: Payer: BLUE CROSS/BLUE SHIELD

## 2018-08-31 DIAGNOSIS — N6310 Unspecified lump in the right breast, unspecified quadrant: Secondary | ICD-10-CM

## 2018-10-20 ENCOUNTER — Encounter: Payer: Self-pay | Admitting: Obstetrics and Gynecology

## 2018-10-20 ENCOUNTER — Other Ambulatory Visit: Payer: Self-pay

## 2018-10-20 ENCOUNTER — Ambulatory Visit: Payer: BLUE CROSS/BLUE SHIELD | Admitting: Obstetrics and Gynecology

## 2018-10-20 VITALS — BP 110/88 | HR 84 | Resp 14 | Ht 65.0 in | Wt 194.4 lb

## 2018-10-20 DIAGNOSIS — Z79890 Hormone replacement therapy (postmenopausal): Secondary | ICD-10-CM | POA: Diagnosis not present

## 2018-10-20 DIAGNOSIS — R4586 Emotional lability: Secondary | ICD-10-CM | POA: Diagnosis not present

## 2018-10-20 DIAGNOSIS — R635 Abnormal weight gain: Secondary | ICD-10-CM

## 2018-10-20 MED ORDER — ESTRADIOL 1 MG PO TABS: 1.0000 mg | ORAL_TABLET | Freq: Every day | ORAL | 1 refills | Status: DC

## 2018-10-20 NOTE — Progress Notes (Signed)
GYNECOLOGY  VISIT   HPI: 57 y.o.   Married White or Caucasian Not Hispanic or Latino  female   [id number] with Patient's last menstrual period was 03/29/2018.   here for   6 month HRT follow up. The patient has a h/o an endometrial ablation in 2002. She was previously on unopposed estrogen. Started on HRT by another provider over a year ago because of AUB.  She was seen here last spring with continued abnormal bleeding. She underwent a hysteroscopy, polypectomy and D&C in 4/19. Polyp was benign, inactive endometrium.   She has been on 0.5 mg of estradiol a day and provera daily for the last several months. She doesn't feel as week on the lower dose of estrogen. Feels more emotional, she has gained weight (she has also changed her diet secondary to gout). She didn't drop her estradiol from 1 mg to 0.5 mg until July. Feels her symptoms correlate with her drop in estrogen.  She has had 3 falls over the summer. Wonders if that is from the decrease in estrogen.  Not hot flashes or night sweats, no vaginal bleeding. She has some vaginal dryness. Slight drop in libido, still able to enjoy intercourse, not as often. She is sleeping okay.  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 She has klonopin for her anxiety. She was previously on lexapro and wellbutrin.  Really doesn't want to go back on antidepressants.    She had her f/u breast imaging in 9/19, benign. F/U recommended in one year.   GYNECOLOGIC HISTORY: Patient's last menstrual period was 03/29/2018. Contraception: Tubal ligation  Menopausal hormone therapy: estradiol tablets, provera        OB History    Gravida  2   Para      Term      Preterm      AB  2   Living        SAB      TAB      Ectopic  2   Multiple      Live Births                 Patient Active Problem List   Diagnosis Date Noted  . Thyroid nodule 08/24/2018  . Anxiety 02/15/2018  . Skin lesions  12/03/2017  . Restless leg syndrome 11/06/2017  . Chest pain with moderate risk for cardiac etiology 04/30/2017  . Abnormal finding on EKG 04/30/2017  . Dyslipidemia 04/30/2017  . Essential hypertension 04/30/2017    Past Medical History:  Diagnosis Date  . Abnormal uterine bleeding   . Allergic rhinitis   . Anxiety   . Chronic constipation    03-29-2018 per pt hx fecal retention  . Depression   . GERD (gastroesophageal reflux disease)   . Hiatal hernia   . History of basal cell carcinoma (BCC) excision    2016  nasal area  . Hyperlipidemia   . Hypertension   . Infertility, female   . OA (osteoarthritis)    left shoulder(spur), left hip(spur), spine  . PVC's (premature ventricular contractions)    03-29-2018  per pt since age 34s, hx holter monitor showed pvc's, per pt has never caused sob or chest pain  . Restless leg syndrome   . Seasonal allergies   . Wears glasses     Past Surgical History:  Procedure Laterality Date  . DILATATION & CURETTAGE/HYSTEROSCOPY WITH MYOSURE N/A 04/05/2018   Procedure: DILATATION & CURETTAGE/HYSTEROSCOPY WITH  [email or web address];  Surgeon: Salvadore Dom, MD;  Location: [facility];  Service: Gynecology;  Laterality: N/A;  . DILATION AND CURETTAGE OF UTERUS  08/1996   w/ suction for missed ab  . ENDOMETRIAL ABLATION  2002   "per pt cryo endometrial ablation"  . LAPAROSCOPIC CHOLECYSTECTOMY  12/2010  . LUMBAR LAMINECTOMY  03/2000;   RE-DO  12/ 2001   L4-5  . LUMBAR LAMINECTOMY  2001  . MOHS SURGERY  2016   left side of nose   . TUBAL LIGATION  09/1999  . TUBAL LIGATION  01/1999    Current Outpatient Medications  Medication Sig Dispense Refill  . Ascorbic Acid (VITAMIN C) 500 MG CAPS     . aspirin EC 81 MG tablet Take 81 mg by mouth daily.    . Calcium Citrate-Vitamin D (CALCIUM CITRATE + D3 PO) Take by mouth.    . clonazePAM (KLONOPIN) 1 MG tablet Take 1 tablet (1 mg total) by mouth 3 (three) times daily as needed for  anxiety. 90 tablet 5  . estradiol (ESTRACE) 0.5 MG tablet Take 0.5 mg by mouth every morning.     . Garlic 2119 MG CAPS Take 1,000 mg by mouth daily.    . hydrochlorothiazide (HYDRODIURIL) 25 MG tablet TAKE 1 TABLET BY MOUTH EVERY DAY 90 tablet 3  . KRILL OIL PO Take 1 capsule by mouth daily. 300 MG    . L-LYSINE PO Take by mouth.    Marland Kitchen lisinopril (PRINIVIL,ZESTRIL) 10 MG tablet Take 10 mg by mouth every evening.     . loratadine (CLARITIN) 10 MG tablet Take 10 mg by mouth daily as needed for allergies.     . medroxyPROGESTERone (PROVERA) 2.5 MG tablet Take 2.5 mg by mouth every evening.     . Melatonin 10 MG CAPS Take 10 mg by mouth daily as needed.    . Multiple Vitamin (MULTIVITAMIN WITH MINERALS) TABS tablet Take 1 tablet by mouth daily.    . Multiple Vitamins-Minerals (PRESERVISION AREDS 2 PO) Take 1 capsule by mouth daily.    Marland Kitchen omeprazole (PRILOSEC) 20 MG capsule Take 20 mg by mouth every other day. Pt takes in pm    . Polyethylene Glycol 3350 (MIRALAX PO) Take by mouth 3 (three) times a week.    . Red Yeast Rice 600 MG TABS Take 600 mg by mouth daily.     No current facility-administered medications for this visit.      ALLERGIES: Tape  Family History  Problem Relation Age of Onset  . Rheumatic fever Mother 77       Rheumatic heart disease - she died in childbirth;  . Early death Mother   . Hearing loss Mother   . Heart disease Mother   . Heart attack Mother   . Hypertension Father   . COPD Father        Also paternal uncle  . Alcohol abuse Father   . Kidney disease Father   . Polycythemia Sister   . Depression Sister   . Coronary artery disease Paternal Grandmother   . Heart attack Paternal Grandmother   . Heart disease Paternal Grandmother   . Depression Sister   . Lung cancer Paternal Uncle        As well as 3 maternal aunts  . Alcohol abuse Maternal Grandfather   . COPD Maternal Grandfather   . Diabetes Maternal Grandfather   . Stroke Maternal Grandfather   .  Alcohol abuse Paternal Grandfather   .  Mental illness Paternal Grandfather   . Lung cancer Maternal Aunt   . Brain cancer Maternal Aunt     Social History   Socioeconomic History  . Marital status: Married    Spouse name: Not on file  . Number of children: Not on file  . Years of education: Not on file  . Highest education level: Bachelor's degree (e.g., BA, AB, BS)  Occupational History  . Occupation: Self Employed  Social Needs  . Financial resource strain: Not on file  . Food insecurity:    Worry: Not on file    Inability: Not on file  . Transportation needs:    Medical: Not on file    Non-medical: Not on file  Tobacco Use  . Smoking status: Former Smoker    Packs/day: 0.50    Years: 15.00    Pack years: 7.50    Types: Cigarettes    Last attempt to quit: 04/28/2012    Years since quitting: 6.4  . Smokeless tobacco: Never Used  Substance and Sexual Activity  . Alcohol use: Not Currently    Frequency: Never  . Drug use: No  . Sexual activity: Yes    Partners: Male    Birth control/protection: Surgical    Comment: Tubal Ligaton   Lifestyle  . Physical activity:    Days per week: Not on file    Minutes per session: Not on file  . Stress: Not on file  Relationships  . Social connections:    Talks on phone: Not on file    Gets together: Not on file    Attends religious service: Not on file    Active member of club or organization: Not on file    Attends meetings of clubs or organizations: Not on file    Relationship status: Not on file  . Intimate partner violence:    Fear of current or ex partner: Not on file    Emotionally abused: Not on file    Physically abused: Not on file    Forced sexual activity: Not on file  Other Topics Concern  . Not on file  Social History Narrative   She and her husband Clair Gulling) recently moved from [facility] down to [place] for better job opportunities. 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 She is now ready to set herself back up working.      - second marriage. No children. Her current husband Clair Gulling) has children and grandchildren.Granddaughter just diagnosed with cancer   She is starting to try to go out and exercise with her husband walking on it routinely.    Review of Systems  Constitutional:       Weight gain    HENT: Negative.   Eyes: Negative.   Respiratory: Negative.   Cardiovascular: Negative.   Gastrointestinal: Positive for constipation.       Bloating   Genitourinary:       Loss of urine with sneeze or cough   Musculoskeletal: Positive for joint pain and myalgias.  Skin: Negative.   Neurological: Positive for loss of consciousness.       Excessive crying   Endo/Heme/Allergies: Negative.   Psychiatric/Behavioral: The patient is nervous/anxious.     PHYSICAL EXAMINATION:    BP 110/88 (BP Location: Right Arm, Patient Position: Sitting, Cuff Size: Normal)   Pulse 84   Resp 14  Ht 5\' 5"  (1.651 m)   Wt 194 lb 6.4 oz (88.2 kg)   LMP 03/29/2018   BMI 32.35 kg/m     General appearance: alert, cooperative and appears stated age   ASSESSMENT HRT, she feels miserable on the lower dose of HRT. She c/o mood changes, anxiety, weight gain.     PLAN Discussed starting an SSRI, she prefers not to. She wants to go up on her estrogen dose. We discussed the recommendations for HRT and the risks (including but not limited to blood clots, stroke, breast cancer) She wants to go up on the estradiol to 1 mg a day She has provera Will f/u for an annual exam in the spring At her next visit will discuss trying to wean down again on the ERT (she finishes school in May)     An After Visit Summary was printed and given to the patient.  ~25 minutes face to face time of which over 50% was spent in counseling.

## 2018-11-16 ENCOUNTER — Telehealth: Payer: Self-pay | Admitting: Family Medicine

## 2018-11-16 ENCOUNTER — Other Ambulatory Visit: Payer: Self-pay

## 2018-11-16 MED ORDER — LISINOPRIL 10 MG PO TABS: 10.0000 mg | ORAL_TABLET | Freq: Every evening | ORAL | 1 refills | Status: DC

## 2018-11-16 NOTE — Telephone Encounter (Signed)
Rx sent to pharmacy   

## 2018-11-16 NOTE — Telephone Encounter (Signed)
Copied from [place] [id number]. Topic: Quick Communication - Rx Refill/Question >> Nov 16, 2018  8:21 AM Scherrie Gerlach wrote: Medication:  lisinopril (PRINIVIL,ZESTRIL) 10 MG tablet  (one a day) Dr Jerline Pain has never written this Rx for this pt, as she is new with him. Under previous dr.  Abbott Pao needs refill 90 day [id number] - [place], [place] [phone] (Phone) [phone] (Fax)

## 2018-12-17 ENCOUNTER — Encounter: Payer: Self-pay | Admitting: Obstetrics and Gynecology

## 2018-12-17 ENCOUNTER — Other Ambulatory Visit: Payer: Self-pay | Admitting: Obstetrics and Gynecology

## 2018-12-17 NOTE — Telephone Encounter (Signed)
Med refill request: Provera 2.5mg  tab daily Last AEX: 03/03/18, seen as new Pt for AUB Next AEX: 05/10/19 with Dr. Nelson Chimes Last MMG (if hormonal med): Bilateral Dx MMG & Right BR [place] 08/31/18, f/u screening MMG 1 yr.  Refill authorized: Please Advise?

## 2018-12-17 NOTE — Telephone Encounter (Signed)
Patient sent the following correspondence through [facility]. Routing to triage to assist patient with request.  My medroxyPROGESTERone 2.5 MG tablet needs to a new prescription. This was originally prescribed by my old OB/GYN Dr. Linward Natal so I need you to submit a new presciption. [facility] [address] and I will be out of this medication by 01/01/2019. The prescription I had was for 1/day for 90 days. I had it on auto-refill which is why I didn't need a new prescription before now.  Thanks,  Alice Hodges

## 2018-12-19 MED ORDER — MEDROXYPROGESTERONE ACETATE 2.5 MG PO TABS: 2.5000 mg | ORAL_TABLET | Freq: Every evening | ORAL | 1 refills | Status: DC

## 2018-12-20 NOTE — Telephone Encounter (Signed)
MyChart message to patient notifying of refill.  

## 2019-03-09 ENCOUNTER — Telehealth: Payer: Self-pay | Admitting: Family Medicine

## 2019-03-10 NOTE — Telephone Encounter (Signed)
PT submitted on Tuesday and has had no reply, [facility] said it may [facility] been declined, pt wants to be contacted if there is a denial. [phone]

## 2019-03-10 NOTE — Telephone Encounter (Signed)
Patient called, left VM to return call to the office to schedule a physical with Dr. Jerline Pain in order to obtain refills on medications.

## 2019-03-15 ENCOUNTER — Ambulatory Visit (INDEPENDENT_AMBULATORY_CARE_PROVIDER_SITE_OTHER): Payer: BLUE CROSS/BLUE SHIELD | Admitting: Family Medicine

## 2019-03-15 ENCOUNTER — Encounter: Payer: Self-pay | Admitting: Family Medicine

## 2019-03-15 ENCOUNTER — Other Ambulatory Visit: Payer: Self-pay

## 2019-03-15 VITALS — BP 118/74 | HR 68 | Temp 98.3°F | Ht 65.0 in | Wt 175.4 lb

## 2019-03-15 DIAGNOSIS — Z0001 Encounter for general adult medical examination with abnormal findings: Secondary | ICD-10-CM | POA: Diagnosis not present

## 2019-03-15 DIAGNOSIS — M25552 Pain in left hip: Secondary | ICD-10-CM

## 2019-03-15 DIAGNOSIS — M109 Gout, unspecified: Secondary | ICD-10-CM

## 2019-03-15 DIAGNOSIS — E785 Hyperlipidemia, unspecified: Secondary | ICD-10-CM

## 2019-03-15 DIAGNOSIS — M25512 Pain in left shoulder: Secondary | ICD-10-CM

## 2019-03-15 DIAGNOSIS — R739 Hyperglycemia, unspecified: Secondary | ICD-10-CM

## 2019-03-15 DIAGNOSIS — F419 Anxiety disorder, unspecified: Secondary | ICD-10-CM

## 2019-03-15 DIAGNOSIS — Z6829 Body mass index (BMI) 29.0-29.9, adult: Secondary | ICD-10-CM

## 2019-03-15 DIAGNOSIS — I10 Essential (primary) hypertension: Secondary | ICD-10-CM

## 2019-03-15 DIAGNOSIS — K219 Gastro-esophageal reflux disease without esophagitis: Secondary | ICD-10-CM

## 2019-03-15 LAB — COMPREHENSIVE METABOLIC PANEL
ALBUMIN: 4.2 g/dL (ref 3.5–5.2)
ALK PHOS: 45 U/L (ref 39–117)
ALT: 16 U/L (ref 0–35)
AST: 18 U/L (ref 0–37)
BILIRUBIN TOTAL: 0.8 mg/dL (ref 0.2–1.2)
BUN: 16 mg/dL (ref 6–23)
CALCIUM: 9.7 mg/dL (ref 8.4–10.5)
CO2: 30 mEq/L (ref 19–32)
Chloride: 104 mEq/L (ref 96–112)
Creatinine, Ser: 0.77 mg/dL (ref 0.40–1.20)
GFR: 77.01 mL/min (ref >60.00)
GLUCOSE: 92 mg/dL (ref 70–99)
Potassium: 3.7 mEq/L (ref 3.5–5.1)
Sodium: 142 mEq/L (ref 135–145)
TOTAL PROTEIN: 6.7 g/dL (ref 6.0–8.3)

## 2019-03-15 LAB — CBC
HCT: 41.2 % (ref 36.0–46.0)
HEMOGLOBIN: 14.3 g/dL (ref 12.0–15.0)
MCHC: 34.6 g/dL (ref 30.0–36.0)
MCV: 88.3 fl (ref 78.0–100.0)
PLATELETS: 255 10*3/uL (ref 150.0–400.0)
RBC: 4.67 Mil/uL (ref 3.87–5.11)
RDW: 13.3 % (ref 11.5–15.5)
WBC: 7.5 10*3/uL (ref 4.0–10.5)

## 2019-03-15 LAB — TSH: TSH: 0.72 u[IU]/mL (ref 0.35–4.50)

## 2019-03-15 LAB — LIPID PANEL
CHOL/HDL RATIO: 4
Cholesterol: 179 mg/dL (ref 0–200)
HDL: 49.1 mg/dL (ref >39.00)
LDL Cholesterol: 107 mg/dL — ABNORMAL HIGH (ref 0–99)
NonHDL: 129.52
TRIGLYCERIDES: 113 mg/dL (ref 0.0–149.0)
VLDL: 22.6 mg/dL (ref 0.0–40.0)

## 2019-03-15 LAB — HEMOGLOBIN A1C: Hgb A1c MFr Bld: 5.6 % (ref 4.6–6.5)

## 2019-03-15 MED ORDER — CLONAZEPAM 1 MG PO TABS: 1.0000 mg | ORAL_TABLET | Freq: Three times a day (TID) | ORAL | 5 refills | Status: DC | PRN

## 2019-03-15 MED ORDER — DICLOFENAC SODIUM 75 MG PO TBEC: 75.0000 mg | DELAYED_RELEASE_TABLET | Freq: Two times a day (BID) | ORAL | 0 refills | Status: DC

## 2019-03-15 NOTE — Assessment & Plan Note (Signed)
Stable.  Continue Klonopin 1 mg 3 times daily as needed.

## 2019-03-15 NOTE — Progress Notes (Signed)
Chief Complaint:  Alice Hodges is a 58 y.o. female who presents today for her annual comprehensive physical exam.    Assessment/Plan:  Essential hypertension At goal.  Stop HCTZ.  Continue lisinopril 10 mg daily.  Check CBC, C met, and TSH.  Dyslipidemia Check lipid panel.  Anxiety Stable.  Continue Klonopin 1 mg 3 times daily as needed.  GERD (gastroesophageal reflux disease) Stable.  Continue omeprazole 20 mg every other day.  Gout Stable off medications.  Continue diet modifications.  Hyperglycemia Check A1c  BMI 29 Alice Hodges lost about 20 pounds over last 6 months. Congratulated Alice Hodges on this. Continue lifestyle modifications.   Shoulder Pain Mild rotator cuff tendinopathy.  Discussed home exercise program and handout was given.  Start diclofenac 75 mg twice daily for 1 to 2 weeks.  Left hip pain Likely secondary to hip abductor strain/weakness.  No red flags.  Discussed home exercise program and handout was given.  Start diclofenac 75 mg twice daily for 1 to 2 weeks.  If no improvement consider referral to sports medicine.  Preventative Healthcare: UTD on vaccines and screenings.   Alice Hodges Counseling(The following topics were reviewed and/or handout was given):  -Nutrition: Stressed importance of moderation in sodium/caffeine intake, saturated fat and cholesterol, caloric balance, sufficient intake of fresh fruits, vegetables, and fiber.  -Stressed the importance of regular exercise.   -Substance Abuse: Discussed cessation/primary prevention of tobacco, alcohol, or other drug use; driving or other dangerous activities under the influence; availability of treatment for abuse.   -Injury prevention: Discussed safety belts, safety helmets, smoke detector, smoking near bedding or upholstery.   -Sexuality: Discussed sexually transmitted diseases, partner selection, use of condoms, avoidance of unintended pregnancy and contraceptive alternatives.   -Dental health:  Discussed importance of regular tooth brushing, flossing, and dental visits.  -Health maintenance and immunizations reviewed. Please refer to Health maintenance section.  Return to care in 1 year for next preventative visit.     Subjective:  HPI:  Alice Hodges has no acute complaints today.   Alice Hodges has a several year history of intermittent left shoulder pain and left hip pain.  Alice Hodges has seen orthopedics in the past for left shoulder pain was told that Alice Hodges had a bone spur.  Was also told that Alice Hodges has a bone spur in her left hip as well.  Symptoms seem to be worsening over the last couple of weeks.  Alice Hodges has tried taking ibuprofen with no significant provement.  Symptoms seem to be worse with movement and certain sleep positions.    Her stable, chronic medical conditions are outlined below:  # Essential Hypertension - On HCTZ 26m daily and lisinopril 113mdaily - ROS: No reported chest pain or shortness of breath  # Anxiety - On klonopin 71m76mhree times daily as needed - ROS: No reported SI or HI  # Gout - Diet controlled - No recent flares  # GERD - On omeprazole 27m59mery other day and doing well - ROS: No reported unintentional weight loss or early satiety  Lifestyle Diet: Tries to eat a healthy, balanced diet.  Exercise: Walks 4-5 miles at a time.   Depression screen PHQ 2/9 11/06/2017  Decreased Interest 0  Down, Depressed, Hopeless 0  PHQ - 2 Score 0    Health Maintenance Due  Topic Date Due  . HIV Screening  04/06[birth year]1976    ROS: Per HPI, otherwise a complete review of systems was negative.   PMH:  The following were reviewed and entered/updated  in epic: Past Medical History:  Diagnosis Date  . Abnormal uterine bleeding   . Allergic rhinitis   . Anxiety   . Chronic constipation    03-29-2018 per pt hx fecal retention  . Depression   . GERD (gastroesophageal reflux disease)   . Hiatal hernia   . History of basal cell carcinoma (BCC) excision    2016  nasal area  .  Hyperlipidemia   . Hypertension   . Infertility, female   . OA (osteoarthritis)    left shoulder(spur), left hip(spur), spine  . PVC's (premature ventricular contractions)    03-29-2018  per pt since age 31s, hx holter monitor showed pvc's, per pt has never caused sob or chest pain  . Restless leg syndrome   . Seasonal allergies   . Wears glasses    Alice Hodges Active Problem List   Diagnosis Date Noted  . GERD (gastroesophageal reflux disease) 03/15/2019  . Gout 03/15/2019  . Thyroid nodule 08/24/2018  . Anxiety 02/15/2018  . Skin lesions 12/03/2017  . Restless leg syndrome 11/06/2017  . Chest pain with moderate risk for cardiac etiology 04/30/2017  . Abnormal finding on EKG 04/30/2017  . Dyslipidemia 04/30/2017  . Essential hypertension 04/30/2017   Past Surgical History:  Procedure Laterality Date  . DILATATION & CURETTAGE/HYSTEROSCOPY WITH MYOSURE N/A 04/05/2018   Procedure: DILATATION & CURETTAGE/HYSTEROSCOPY WITH MYOSURE;  Surgeon: Salvadore Dom, MD;  Location: [facility];  Service: Gynecology;  Laterality: N/A;  . DILATION AND CURETTAGE OF UTERUS  08/1996   w/ suction for missed ab  . ENDOMETRIAL ABLATION  2002   "per pt cryo endometrial ablation"  . LAPAROSCOPIC CHOLECYSTECTOMY  12/2010  . LUMBAR LAMINECTOMY  03/2000;   RE-DO  12/ 2001   L4-5  . LUMBAR LAMINECTOMY  2001  . MOHS SURGERY  2016   left side of nose   . TUBAL LIGATION  09/1999  . TUBAL LIGATION  01/1999    Family History  Problem Relation Age of Onset  . Rheumatic fever Mother 20       Rheumatic heart disease - Alice Hodges died in childbirth;  . Early death Mother   . Hearing loss Mother   . Heart disease Mother   . Heart attack Mother   . Hypertension Father   . COPD Father        Also paternal uncle  . Alcohol abuse Father   . Kidney disease Father   . Polycythemia Sister   . Depression Sister   . Coronary artery disease Paternal Grandmother   . Heart attack Paternal Grandmother    . Heart disease Paternal Grandmother   . Depression Sister   . Lung cancer Paternal Uncle        As well as 3 maternal aunts  . Alcohol abuse Maternal Grandfather   . COPD Maternal Grandfather   . Diabetes Maternal Grandfather   . Stroke Maternal Grandfather   . Alcohol abuse Paternal Grandfather   . Mental illness Paternal Grandfather   . Lung cancer Maternal Aunt   . Brain cancer Maternal Aunt     Medications- reviewed and updated Current Outpatient Medications  Medication Sig Dispense Refill  . Ascorbic Acid (VITAMIN C) 500 MG CAPS     . aspirin EC 81 MG tablet Take 81 mg by mouth daily.    . Calcium Citrate-Vitamin D (CALCIUM CITRATE + D3 PO) Take by mouth.    . clonazePAM (KLONOPIN) 1 MG tablet Take 1 tablet (1 mg total)  by mouth 3 (three) times daily as needed for anxiety. 90 tablet 5  . estradiol (ESTRACE) 1 MG tablet Take 1 tablet (1 mg total) by mouth daily. 90 tablet 1  . Garlic 5726 MG CAPS Take 1,000 mg by mouth daily.    Marland Kitchen KRILL OIL PO Take 1 capsule by mouth daily. 300 MG    . L-LYSINE PO Take by mouth.    Marland Kitchen lisinopril (PRINIVIL,ZESTRIL) 10 MG tablet Take 1 tablet (10 mg total) by mouth every evening. 90 tablet 1  . medroxyPROGESTERone (PROVERA) 2.5 MG tablet Take 1 tablet (2.5 mg total) by mouth every evening. 90 tablet 1  . Melatonin 10 MG CAPS Take 10 mg by mouth daily as needed.    . Multiple Vitamin (MULTIVITAMIN WITH MINERALS) TABS tablet Take 1 tablet by mouth daily.    . Multiple Vitamins-Minerals (PRESERVISION AREDS 2 PO) Take 1 capsule by mouth daily.    Marland Kitchen omeprazole (PRILOSEC) 20 MG capsule Take 20 mg by mouth every other day. Pt takes in pm    . Red Yeast Rice 600 MG TABS Take 600 mg by mouth daily.    . diclofenac (VOLTAREN) 75 MG EC tablet Take 1 tablet (75 mg total) by mouth 2 (two) times daily. 30 tablet 0   No current facility-administered medications for this visit.     Allergies-reviewed and updated Allergies  Allergen Reactions  . Tape Rash     Social History   Socioeconomic History  . Marital status: Married    Spouse name: Not on file  . Number of children: Not on file  . Years of education: Not on file  . Highest education level: Bachelor's degree (e.g., BA, AB, BS)  Occupational History  . Occupation: Self Employed  Social Needs  . Financial resource strain: Not on file  . Food insecurity:    Worry: Not on file    Inability: Not on file  . Transportation needs:    Medical: Not on file    Non-medical: Not on file  Tobacco Use  . Smoking status: Former Smoker    Packs/day: 0.50    Years: 15.00    Pack years: 7.50    Types: Cigarettes    Last attempt to quit: 04/28/2012    Years since quitting: 6.8  . Smokeless tobacco: Never Used  Substance and Sexual Activity  . Alcohol use: Not Currently    Frequency: Never  . Drug use: No  . Sexual activity: Yes    Partners: Male    Birth control/protection: Surgical    Comment: Tubal Ligaton   Lifestyle  . Physical activity:    Days per week: Not on file    Minutes per session: Not on file  . Stress: Not on file  Relationships  . Social connections:    Talks on phone: Not on file    Gets together: Not on file    Attends religious service: Not on file    Active member of club or organization: Not on file    Attends meetings of clubs or organizations: Not on file    Relationship status: Not on file  Other Topics Concern  . Not on file  Social History Narrative   Alice Hodges and her husband Clair Gulling) recently moved from [place] down to [place] for better job opportunities. They had over a year trying to sell the house which put her in some financial difficulties and was having to not work in order to keep her workshop  clean. Alice Hodges is now ready to set herself back up working.      - second marriage. No children. Her current husband Clair Gulling) has children and grandchildren.Granddaughter just diagnosed with cancer   Alice Hodges is starting to try to go out and exercise  with her husband walking on it routinely.        Objective:  Physical Exam: BP 118/74 (BP Location: Left Arm, Alice Hodges Position: Sitting, Cuff Size: Normal)   Pulse 68   Temp 98.3 F (36.8 C) (Oral)   Ht 5' 5" (1.651 m)   Wt 175 lb 6.4 oz (79.6 kg)   SpO2 98%   BMI 29.19 kg/m   Body mass index is 29.19 kg/m. Wt Readings from Last 3 Encounters:  03/15/19 175 lb 6.4 oz (79.6 kg)  10/20/18 194 lb 6.4 oz (88.2 kg)  08/24/18 196 lb 3.2 oz (89 kg)   Gen: NAD, resting comfortably HEENT: TMs normal bilaterally. OP clear. No thyromegaly noted.  CV: RRR with no murmurs appreciated Pulm: NWOB, CTAB with no crackles, wheezes, or rhonchi GI: Normal bowel sounds present. Soft, Nontender, Nondistended. MSK:  -Left Shoulder: No deformities.  Tender palpation along lateral posterior aspect.  Rotator cuff strength testing intact.  Neurovascular intact distally. -Left hip: No deformities.  Tender palpation along greater trochanter.Pain worsened with resisted FABER and hip abduction.  Skin: warm, dry Neuro: CN2-12 grossly intact. Strength 5/5 in upper and lower extremities. Reflexes symmetric and intact bilaterally.  Psych: Normal affect and thought content     Caleb M. Jerline Pain, MD 03/15/2019 2:25 PM

## 2019-03-15 NOTE — Assessment & Plan Note (Signed)
Check lipid panel  

## 2019-03-15 NOTE — Assessment & Plan Note (Signed)
Stable. Continue omeprazole 20mg every other day.

## 2019-03-15 NOTE — Assessment & Plan Note (Signed)
At goal.  Stop HCTZ.  Continue lisinopril 10 mg daily.  Check CBC, C met, and TSH.

## 2019-03-15 NOTE — Patient Instructions (Signed)
It was very nice to see you today!  Keep up the good work!  Please work on exercises.  Use the diclofenac twice daily for the next 1 to 2 weeks.  Let us know if your symptoms worsen or do not improve over this time.  I will refill your Klonopin today.  We will check blood work today.  Please come back to see me for follow-up visit in 6 months, or sooner as needed.  Take care, Dr Jerline Pain

## 2019-03-15 NOTE — Assessment & Plan Note (Signed)
Stable off medications.  Continue diet modifications.

## 2019-03-16 NOTE — Progress Notes (Signed)
Dr Marigene Ehlers interpretation of your lab work:  Good news! Your blood work looks good. Your "bad" cholesterol is just a little elevated, but not to the point where we need to start medications.  All of your other labs are NORMAL. Keep up the good work with a healthy diet and regular exercise and we can recheck in a year.    If you have any additional questions, please give [place] a call or send [place] a message through [place].  Take care, Dr Jerline Pain

## 2019-04-13 ENCOUNTER — Encounter: Payer: Self-pay | Admitting: Family Medicine

## 2019-04-17 ENCOUNTER — Other Ambulatory Visit: Payer: Self-pay | Admitting: Obstetrics and Gynecology

## 2019-04-18 NOTE — Telephone Encounter (Signed)
Refill for estradiol sent, she should have enough progesterone through mid June

## 2019-04-18 NOTE — Telephone Encounter (Signed)
Medication refill request: Estradiol Last OV:  10/20/18 JJ Next AEX: 05/11/19 Last MMG (if hormonal medication request): 08/31/18 Diagnostic MM/right Breast US - BIRADS 1 negative/density b Refill authorized: Please advise on refill; Order pended #90 w/0 refills if authorized

## 2019-04-29 ENCOUNTER — Encounter: Payer: Self-pay | Admitting: Obstetrics and Gynecology

## 2019-04-29 ENCOUNTER — Encounter: Payer: Self-pay | Admitting: Family Medicine

## 2019-05-11 ENCOUNTER — Ambulatory Visit: Payer: BLUE CROSS/BLUE SHIELD | Admitting: Obstetrics and Gynecology

## 2019-05-24 ENCOUNTER — Other Ambulatory Visit: Payer: Self-pay

## 2019-05-24 MED ORDER — LISINOPRIL 10 MG PO TABS: 10.0000 mg | ORAL_TABLET | Freq: Every evening | ORAL | 1 refills | Status: DC

## 2019-06-20 ENCOUNTER — Other Ambulatory Visit: Payer: Self-pay

## 2019-06-20 ENCOUNTER — Encounter: Payer: Self-pay | Admitting: Obstetrics and Gynecology

## 2019-06-20 ENCOUNTER — Ambulatory Visit: Payer: BC Managed Care – PPO | Admitting: Obstetrics and Gynecology

## 2019-06-20 VITALS — BP 128/76 | HR 82 | Temp 98.4°F | Ht 65.0 in | Wt 173.8 lb

## 2019-06-20 DIAGNOSIS — Z01419 Encounter for gynecological examination (general) (routine) without abnormal findings: Secondary | ICD-10-CM | POA: Diagnosis not present

## 2019-06-20 DIAGNOSIS — Z79890 Hormone replacement therapy (postmenopausal): Secondary | ICD-10-CM | POA: Diagnosis not present

## 2019-06-20 MED ORDER — MEDROXYPROGESTERONE ACETATE 2.5 MG PO TABS: 2.5000 mg | ORAL_TABLET | Freq: Every evening | ORAL | 3 refills | Status: DC

## 2019-06-20 MED ORDER — ESTRADIOL 1 MG PO TABS: 1.0000 mg | ORAL_TABLET | Freq: Every day | ORAL | 3 refills | Status: DC

## 2019-06-20 NOTE — Progress Notes (Signed)
58 y.o. [id number] Married White or Caucasian Not Hispanic or Latino female here for annual exam.  H/O endometrial ablation. On HRT, tried decreasing her dose last year and didn't feel well. She is on oral HRT, allergy to tape.  No vaginal bleeding. Sexually active, no pain. Slight dryness. Not needing lubricant.   Occasional has hesitancy to void, usually in the middle of the night, occurs ~2 x a month. She drinks lots of water, some coffee. She is overfull when those episodes occur. No leakage. Up 1 x a night to void.  H/O depression and anxiety, Covid has increased her stress. Overall she is doing okay. She has clonazepam for her anxiety, managed by her primary.    No LMP recorded. (Menstrual status: Perimenopausal).          Sexually active: Yes.    The current method of family planning is tubal ligation.    Exercising: Yes.    badmitton 4 times a week, yoga Smoker:  no  Health Maintenance: Pap:  07/28/2017 WNL, 07-21-16 WNL History of abnormal Pap:  No MMG:  08/31/2018 Birads 1 negative Colonoscopy:  01-10-14 WNL BMD:   02-11-12 WNL  TDaP:  04-26-15 Gardasil: No   reports that she quit smoking about 7 years ago. Her smoking use included cigarettes. She has a 7.50 pack-year smoking history. She has never used smokeless tobacco. She reports previous alcohol use. She reports that she does not use drugs. No ETOH. She is a [employer], not currently working.  Husband is a Production manager and writes [employer].   Past Medical History:  Diagnosis Date  . Abnormal uterine bleeding   . Allergic rhinitis   . Anxiety   . Chronic constipation    03-29-2018 per pt hx fecal retention  . Depression   . GERD (gastroesophageal reflux disease)   . Hiatal hernia   . History of basal cell carcinoma (BCC) excision    2016  nasal area  . Hyperlipidemia   . Hypertension   . Infertility, female   . OA (osteoarthritis)    left shoulder(spur), left hip(spur), spine  . PVC's (premature ventricular  contractions)    03-29-2018  per pt since age 58s, hx holter monitor showed pvc's, per pt has never caused sob or chest pain  . Restless leg syndrome   . Seasonal allergies   . Wears glasses     Past Surgical History:  Procedure Laterality Date  . DILATATION & CURETTAGE/HYSTEROSCOPY WITH MYOSURE N/A 04/05/2018   Procedure: DILATATION & CURETTAGE/HYSTEROSCOPY WITH MYOSURE;  Surgeon: Salvadore Dom, MD;  [facility];  Service: Gynecology;  Laterality: N/A;  . DILATION AND CURETTAGE OF UTERUS  08/1996   w/ suction for missed ab  . ENDOMETRIAL ABLATION  2002   "per pt cryo endometrial ablation"  . LAPAROSCOPIC CHOLECYSTECTOMY  12/2010  . LUMBAR LAMINECTOMY  03/2000;   RE-DO  12/ 2001   L4-5  . LUMBAR LAMINECTOMY  2001  . MOHS SURGERY  2016   left side of nose   . TUBAL LIGATION  09/1999  . TUBAL LIGATION  01/1999    Current Outpatient Medications  Medication Sig Dispense Refill  . Ascorbic Acid (VITAMIN C) 500 MG CAPS     . aspirin EC 81 MG tablet Take 81 mg by mouth daily.    . Calcium Citrate-Vitamin D (CALCIUM CITRATE + D3 PO) Take by mouth.    . clonazePAM (KLONOPIN) 1 MG tablet Take 1 tablet (1 mg total) by mouth  3 (three) times daily as needed for anxiety. 90 tablet 5  . estradiol (ESTRACE) 1 MG tablet TAKE 1 TABLET BY MOUTH EVERY DAY 90 tablet 0  . Garlic 3382 MG CAPS Take 1,000 mg by mouth daily.    Marland Kitchen KRILL OIL PO Take 1 capsule by mouth daily. 300 MG    . lisinopril (ZESTRIL) 10 MG tablet Take 1 tablet (10 mg total) by mouth every evening. 90 tablet 1  . medroxyPROGESTERone (PROVERA) 2.5 MG tablet Take 1 tablet (2.5 mg total) by mouth every evening. 90 tablet 1  . Melatonin 10 MG CAPS Take 10 mg by mouth daily as needed.    . Multiple Vitamin (MULTIVITAMIN WITH MINERALS) TABS tablet Take 1 tablet by mouth daily.    . Multiple Vitamins-Minerals (PRESERVISION AREDS 2 PO) Take 1 capsule by mouth daily.    . Red Yeast Rice 600 MG TABS Take 600 mg  by mouth daily.    . Turmeric (QC TUMERIC COMPLEX) 500 MG CAPS Take 500 mg by mouth daily.     No current facility-administered medications for this visit.     Family History  Problem Relation Age of Onset  . Rheumatic fever Mother 61       Rheumatic heart disease - she died in childbirth;  . Early death Mother   . Hearing loss Mother   . Heart disease Mother   . Heart attack Mother   . Hypertension Father   . COPD Father        Also paternal uncle  . Alcohol abuse Father   . Kidney disease Father   . Polycythemia Sister   . Depression Sister   . Coronary artery disease Paternal Grandmother   . Heart attack Paternal Grandmother   . Heart disease Paternal Grandmother   . Depression Sister   . Lung cancer Paternal Uncle        As well as 3 maternal aunts  . Alcohol abuse Maternal Grandfather   . COPD Maternal Grandfather   . Diabetes Maternal Grandfather   . Stroke Maternal Grandfather   . Alcohol abuse Paternal Grandfather   . Mental illness Paternal Grandfather   . Lung cancer Maternal Aunt   . Brain cancer Maternal Aunt     Review of Systems  Constitutional: Negative.   HENT: Negative.   Eyes: Negative.   Respiratory: Negative.   Cardiovascular: Negative.   Gastrointestinal: Negative.   Endocrine: Negative.   Genitourinary: Negative.   Musculoskeletal: Negative.   Skin: Negative.   Allergic/Immunologic: Negative.   Neurological: Negative.   Hematological: Negative.   Psychiatric/Behavioral: Negative.     Exam:   BP 128/76 (BP Location: Left Arm, Patient Position: Sitting, Cuff Size: Normal)   Pulse 82   Temp 98.4 F (36.9 C) (Skin)   Ht 5\' 5"  (1.651 m)   Wt 173 lb 12.8 oz (78.8 kg)   BMI 28.92 kg/m   Weight change: @WEIGHTCHANGE @ Height:   Height: 5\' 5"  (165.1 cm)  Ht Readings from Last 3 Encounters:  06/20/19 5\' 5"  (1.651 m)  03/15/19 5\' 5"  (1.651 m)  10/20/18 5\' 5"  (1.651 m)    General appearance: alert, cooperative and appears stated age Head:  Normocephalic, without obvious abnormality, atraumatic Neck: no adenopathy, supple, symmetrical, trachea midline and thyroid normal to inspection and palpation Lungs: clear to auscultation bilaterally Cardiovascular: regular rate and rhythm Breasts: normal appearance, no masses or tenderness Abdomen: soft, non-tender; non distended,  no masses,  no organomegaly Extremities: extremities normal, atraumatic,  no cyanosis or edema Skin: Skin color, texture, turgor normal. No rashes or lesions Lymph nodes: Cervical, supraclavicular, and axillary nodes normal. No abnormal inguinal nodes palpated Neurologic: Grossly normal   Pelvic: External genitalia:  no lesions              Urethra:  normal appearing urethra with no masses, tenderness or lesions              Bartholins and Skenes: normal                 Vagina: normal appearing vagina with normal color and discharge, no lesions              Cervix: no lesions               Bimanual Exam:  Uterus:  normal size, contour, position, consistency, mobility, non-tender              Adnexa: no mass, fullness, tenderness               Rectovaginal: Confirms               Anus:  normal sphincter tone, no lesions  Chaperone was present for exam.  A:  Well Woman with normal exam  HRT, wants to continue, aware of the risks  P:   Pap next year  Continue HRT  Discussed breast self exam  Discussed calcium and vit D intake  Mammogram in the fall  Colonoscopy UTD  Labs with primary

## 2019-06-20 NOTE — Patient Instructions (Signed)

## 2019-09-18 ENCOUNTER — Other Ambulatory Visit: Payer: Self-pay | Admitting: Family Medicine

## 2019-09-19 NOTE — Telephone Encounter (Signed)
Patient needs appointment for med refill.  Thanks

## 2019-09-20 ENCOUNTER — Encounter: Payer: Self-pay | Admitting: Family Medicine

## 2019-09-20 ENCOUNTER — Ambulatory Visit (INDEPENDENT_AMBULATORY_CARE_PROVIDER_SITE_OTHER): Payer: BC Managed Care – PPO | Admitting: Family Medicine

## 2019-09-20 VITALS — BP 151/93 | HR 52 | Wt 168.0 lb

## 2019-09-20 DIAGNOSIS — F419 Anxiety disorder, unspecified: Secondary | ICD-10-CM | POA: Diagnosis not present

## 2019-09-20 DIAGNOSIS — Z6827 Body mass index (BMI) 27.0-27.9, adult: Secondary | ICD-10-CM | POA: Diagnosis not present

## 2019-09-20 DIAGNOSIS — E663 Overweight: Secondary | ICD-10-CM

## 2019-09-20 DIAGNOSIS — I10 Essential (primary) hypertension: Secondary | ICD-10-CM

## 2019-09-20 MED ORDER — CLONAZEPAM 1 MG PO TABS: 1.0000 mg | ORAL_TABLET | Freq: Three times a day (TID) | ORAL | 5 refills | Status: DC | PRN

## 2019-09-20 NOTE — Progress Notes (Signed)
    Chief Complaint:  Alice Hodges is a 58 y.o. female who presents today for a virtual office visit with a chief complaint of anxiety follow up.   Assessment/Plan:  Anxiety Slightly worsened.  Doing well Klonopin 1 mg 3 times daily.  She would like to continue this.  Discussed addition of an alternative medication such as SSRI however patient inclined.  She will follow-up me in 6 months.  Essential hypertension Slightly above goal.  Typically well controlled.  Will continue home blood pressure monitoring with goal 140/90 or lower.  Continue lisinopril 10 mg daily.    Subjective:  HPI:  Her stable, chronic medical conditions are outlined below:  # Essential Hypertension - On lisinopril 10mg  daily - ROS: No reported chest pain or shortness of breath  # Anxiety - On klonopin 1mg  three times daily as needed - ROS: No reported SI or HI  ROS: Per HPI  PMH: She reports that she quit smoking about 7 years ago. Her smoking use included cigarettes. She has a 7.50 pack-year smoking history. She has never used smokeless tobacco. She reports previous alcohol use. She reports that she does not use drugs.      Objective/Observations  Physical Exam: Gen: NAD, resting comfortably Pulm: Normal work of breathing Neuro: Grossly normal, moves all extremities Psych: Normal affect and thought content  Virtual Visit via Video   I connected with Alice Hodges on 09/20/19 at  4:00 PM EDT by a video enabled telemedicine application and verified that I am speaking with the correct person using two identifiers. I discussed the limitations of evaluation and management by telemedicine and the availability of in person appointments. The patient expressed understanding and agreed to proceed.   Patient location: Home Provider location: [place] participating in the virtual visit: Myself and Patient     Algis Greenhouse. Jerline Pain, MD 09/20/2019 4:26 PM

## 2019-09-20 NOTE — Assessment & Plan Note (Signed)
Slightly worsened.  Doing well Klonopin 1 mg 3 times daily.  She would like to continue this.  Discussed addition of an alternative medication such as SSRI however patient inclined.  She will follow-up me in 6 months.

## 2019-09-20 NOTE — Assessment & Plan Note (Signed)
Slightly above goal.  Typically well controlled.  Will continue home blood pressure monitoring with goal 140/90 or lower.  Continue lisinopril 10 mg daily.

## 2019-09-30 ENCOUNTER — Ambulatory Visit (INDEPENDENT_AMBULATORY_CARE_PROVIDER_SITE_OTHER): Payer: BC Managed Care – PPO

## 2019-09-30 ENCOUNTER — Other Ambulatory Visit: Payer: Self-pay

## 2019-09-30 DIAGNOSIS — Z23 Encounter for immunization: Secondary | ICD-10-CM | POA: Diagnosis not present

## 2019-11-13 ENCOUNTER — Other Ambulatory Visit: Payer: Self-pay | Admitting: Family Medicine

## 2020-02-02 ENCOUNTER — Other Ambulatory Visit: Payer: Self-pay

## 2020-02-02 ENCOUNTER — Encounter: Payer: Self-pay | Admitting: Family Medicine

## 2020-02-03 ENCOUNTER — Encounter: Payer: Self-pay | Admitting: Family Medicine

## 2020-02-03 ENCOUNTER — Ambulatory Visit (INDEPENDENT_AMBULATORY_CARE_PROVIDER_SITE_OTHER): Payer: BC Managed Care – PPO | Admitting: Family Medicine

## 2020-02-03 VITALS — BP 110/72 | HR 48 | Temp 98.0°F | Ht 65.0 in | Wt 169.0 lb

## 2020-02-03 DIAGNOSIS — I10 Essential (primary) hypertension: Secondary | ICD-10-CM

## 2020-02-03 DIAGNOSIS — F419 Anxiety disorder, unspecified: Secondary | ICD-10-CM

## 2020-02-03 DIAGNOSIS — L0291 Cutaneous abscess, unspecified: Secondary | ICD-10-CM | POA: Diagnosis not present

## 2020-02-03 MED ORDER — DOXYCYCLINE HYCLATE 100 MG PO TABS: 100.0000 mg | ORAL_TABLET | Freq: Two times a day (BID) | ORAL | 0 refills | Status: DC

## 2020-02-03 NOTE — Progress Notes (Signed)
   Alice Hodges is a 59 y.o. female who presents today for an office visit.  Assessment/Plan:  New/Acute Problems: Abscess  I&D performed today.  See below procedure note.  Tolerated well.  We will also send in doxycycline 100 mg twice daily for the next week.  Chronic Problems Addressed Today: Anxiety Slightly worsened due to pandemic and work circumstances.  We will continue Klonopin 1 mg 3 times daily.  Does not need refill today.  Essential hypertension At goal.  Continue lisinopril 10 mg daily.    Subjective:  HPI:  Patient has had a cyst on her right breast for the past several weeks.  Has tried hot compresses and ibuprofen with modest improvement.  Has had some drainage as well.  Area is very tender and red.  No reported fevers or chills.       Objective:  Physical Exam: BP 110/72   Pulse (!) 48   Temp 98 F (36.7 C)   Ht 5\' 5"  (1.651 m)   Wt 169 lb (76.7 kg)   SpO2 97%   BMI 28.12 kg/m   Gen: No acute distress, resting comfortably CV: Regular rate and rhythm with no murmurs appreciated Pulm: Normal work of breathing, clear to auscultation bilaterally with no crackles, wheezes, or rhonchi Skin: Approximately 2 cm inflamed nodule on medial aspect of right breast.  Tender to palpation.  No drainage. Neuro: Grossly normal, moves all extremities Psych: Normal affect and thought content  Incision and Drainage Procedure Note  Pre-operative Diagnosis: Abscess  Post-operative Diagnosis: same  Indications: Therapeutic  Anesthesia: 1% lidocaine with epinephrine  Procedure Details  The procedure, risks and complications have been discussed in detail (including, but not limited to airway compromise, infection, bleeding) with the patient, and the patient has given verbal consent to the procedure.  The skin was sterilely prepped and draped over the affected area in the usual fashion. After adequate local anesthesia, I&D with a #11 blade was performed on the right  medial breast Purulent drainage: present The patient was observed until stable.  Findings: Abscess  EBL: 3 cc's  Drains: None  Condition: Tolerated procedure well   Complications: none.        Algis Greenhouse. Jerline Pain, MD 02/03/2020 1:38 PM

## 2020-02-03 NOTE — Patient Instructions (Signed)
It was very nice to see you today!  Please start the doxycycline.  We lanced your abscess today.  Let us know if your symptoms are not improving.  Take care, Dr Jerline Pain   Incision and Drainage, Care After This sheet gives you information about how to care for yourself after your procedure. Your health care provider may also give you more specific instructions. If you have problems or questions, contact your health care provider. What can I expect after the procedure? After the procedure, it is common to have:  Pain or discomfort around the incision site.  Blood, fluid, or pus (drainage) from the incision.  Redness and firm skin around the incision site. Follow these instructions at home: Medicines  Take over-the-counter and prescription medicines only as told by your health care provider.  If you were prescribed an antibiotic medicine, use or take it as told by your health care provider. Do not stop using the antibiotic even if you start to feel better. Wound care Follow instructions from your health care provider about how to take care of your wound. Make sure you:  Wash your hands with soap and water before and after you change your bandage (dressing). If soap and water are not available, use hand sanitizer.  Change your dressing and packing as told by your health care provider. ? If your dressing is dry or stuck when you try to remove it, moisten or wet the dressing with saline or water so that it can be removed without harming your skin or tissues. ? If your wound is packed, leave it in place until your health care provider tells you to remove it. To remove the packing, moisten or wet the packing with saline or water so that it can be removed without harming your skin or tissues.  Leave stitches (sutures), skin glue, or adhesive strips in place. These skin closures may need to stay in place for 2 weeks or longer. If adhesive strip edges start to loosen and curl up, you may  trim the loose edges. Do not remove adhesive strips completely unless your health care provider tells you to do that. Check your wound every day for signs of infection. Check for:  More redness, swelling, or pain.  More fluid or blood.  Warmth.  Pus or a bad smell. If you were sent home with a drain tube in place, follow instructions from your health care provider about:  How to empty it.  How to care for it at home.  General instructions  Rest the affected area.  Do not take baths, swim, or use a hot tub until your health care provider approves. Ask your health care provider if you may take showers. You may only be allowed to take sponge baths.  Return to your normal activities as told by your health care provider. Ask your health care provider what activities are safe for you. Your health care provider may put you on activity or lifting restrictions.  The incision will continue to drain. It is normal to have some clear or slightly bloody drainage. The amount of drainage should lessen each day.  Do not apply any creams, ointments, or liquids unless you have been told to by your health care provider.  Keep all follow-up visits as told by your health care provider. This is important. Contact a health care provider if:  Your cyst or abscess returns.  You have a fever or chills.  You have more redness, swelling, or pain around your incision.  You have more fluid or blood coming from your incision.  Your incision feels warm to the touch.  You have pus or a bad smell coming from your incision.  You have red streaks above or below the incision site. Get help right away if:  You have severe pain or bleeding.  You cannot eat or drink without vomiting.  You have decreased urine output.  You become short of breath.  You have chest pain.  You cough up blood.  The affected area becomes numb or starts to tingle. These symptoms may represent a serious problem that is an  emergency. Do not wait to see if the symptoms will go away. Get medical help right away. Call your local emergency services (911 in the [place] Do not drive yourself to the hospital. Summary  After this procedure, it is common to have fluid, blood, or pus coming from the surgery site.  Follow all home care instructions. You will be told how to take care of your incision, how to check for infection, and how to take medicines.  If you were prescribed an antibiotic medicine, take it as told by your health care provider. Do not stop taking the antibiotic even if you start to feel better.  Contact a health care provider if you have increased redness, swelling, or pain around your incision. Get help right away if you have chest pain, you vomit, you cough up blood, or you have shortness of breath.  Keep all follow-up visits as told by your health care provider. This is important. This information is not intended to replace advice given to you by your health care provider. Make sure you discuss any questions you have with your health care provider. Document Revised: 11/15/2018 Document Reviewed: 11/15/2018 Elsevier Patient Education  2020 [employer].

## 2020-02-03 NOTE — Assessment & Plan Note (Signed)
At goal  Continue lisinopril 10mg daily

## 2020-02-03 NOTE — Assessment & Plan Note (Signed)
Slightly worsened due to pandemic and work circumstances.  We will continue Klonopin 1 mg 3 times daily.  Does not need refill today.

## 2020-02-22 ENCOUNTER — Encounter: Payer: Self-pay | Admitting: Family Medicine

## 2020-02-23 ENCOUNTER — Other Ambulatory Visit: Payer: Self-pay

## 2020-02-23 MED ORDER — DOXYCYCLINE HYCLATE 100 MG PO TABS: 100.0000 mg | ORAL_TABLET | Freq: Every day | ORAL | 0 refills | Status: AC

## 2020-03-04 ENCOUNTER — Encounter: Payer: Self-pay | Admitting: Family Medicine

## 2020-03-06 ENCOUNTER — Encounter: Payer: Self-pay | Admitting: *Deleted

## 2020-03-22 ENCOUNTER — Other Ambulatory Visit: Payer: Self-pay | Admitting: Family Medicine

## 2020-03-22 ENCOUNTER — Encounter: Payer: Self-pay | Admitting: Family Medicine

## 2020-03-22 MED ORDER — CLONAZEPAM 1 MG PO TABS: 1.0000 mg | ORAL_TABLET | Freq: Three times a day (TID) | ORAL | 5 refills | Status: DC | PRN

## 2020-03-22 NOTE — Telephone Encounter (Signed)
Pt requesting refill for Clonazepam 1 mg, last OV 01/2020.

## 2020-04-01 ENCOUNTER — Encounter: Payer: Self-pay | Admitting: Family Medicine

## 2020-04-16 ENCOUNTER — Other Ambulatory Visit: Payer: Self-pay | Admitting: Obstetrics and Gynecology

## 2020-04-16 DIAGNOSIS — Z1231 Encounter for screening mammogram for malignant neoplasm of breast: Secondary | ICD-10-CM

## 2020-04-22 ENCOUNTER — Other Ambulatory Visit: Payer: Self-pay | Admitting: Family Medicine

## 2020-05-16 ENCOUNTER — Other Ambulatory Visit: Payer: Self-pay

## 2020-05-16 ENCOUNTER — Ambulatory Visit
Admission: RE | Admit: 2020-05-16 | Discharge: 2020-05-16 | Disposition: A | Discharge status: Home / Self Care | Payer: BC Managed Care – PPO | Source: Ambulatory Visit

## 2020-05-16 DIAGNOSIS — Z1231 Encounter for screening mammogram for malignant neoplasm of breast: Secondary | ICD-10-CM

## 2020-06-09 ENCOUNTER — Other Ambulatory Visit: Payer: Self-pay | Admitting: Obstetrics and Gynecology

## 2020-06-19 ENCOUNTER — Other Ambulatory Visit: Payer: Self-pay

## 2020-06-19 NOTE — Progress Notes (Signed)
59 y.o. [id number] Married White or Caucasian Not Hispanic or Latino female here for annual exam. She wants to make sure she can continue to take her hormones Patient would like information on a when to get bone density scan.    She has been struggling with anxiety. She started back to work.  No vaginal bleeding. Sexually active, no pain. She is on HRT, she has some night sweats (things related to the room temperature). She tried dropping the estrogen in 1/2 for 2.5 weeks in April. She gained 7 lbs didn't feel as good. Will continue on this dose for now.     She and her husband have had their covid vaccinations.   No LMP recorded. (Menstrual status: Perimenopausal).          Sexually active: Yes.    The current method of family planning is post menopausal status.    Exercising: Yes.    Walking  Smoker:  no  Health Maintenance: Pap:  07/28/2017 WNL, 07-21-16 WNL History of abnormal Pap:  no MMG:  05/18/20 density B Bi-rads 1 neg  Colonoscopy:01-10-14 WNL BMD:02-11-12 WNL TDaP:04-26-15 Gardasil:No   reports that she quit smoking about 8 years ago. Her smoking use included cigarettes. She has a 7.50 pack-year smoking history. She has never used smokeless tobacco. She reports previous alcohol use. She reports that she does not use drugs. She is a [employer], finished her degree last year. 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 A lot of stress at work. Good marriage. Husband is a Writing Professor and writes Novels 2 grown [employer]. Granddaughters are 4 and 6, both with dicer one syndrome, both have had to have partial lung resections. It is a rare type of cancer.  Past Medical History:  Diagnosis Date  . Abnormal uterine bleeding   . Allergic rhinitis   . Anxiety   . Chronic constipation    03-29-2018 per pt hx fecal retention  . Depression   . GERD (gastroesophageal reflux disease)   . Hiatal hernia   . History of basal  cell carcinoma (BCC) excision    2016  nasal area  . Hyperlipidemia   . Hypertension   . Infertility, female   . OA (osteoarthritis)    left shoulder(spur), left hip(spur), spine  . PVC's (premature ventricular contractions)    03-29-2018  per pt since age 89s, hx holter monitor showed pvc's, per pt has never caused sob or chest pain  . Restless leg syndrome   . Seasonal allergies   . Wears glasses     Past Surgical History:  Procedure Laterality Date  . CHOLECYSTECTOMY  01/01/2011  . DILATATION & CURETTAGE/HYSTEROSCOPY WITH MYOSURE N/A 04/05/2018   Procedure: DILATATION & CURETTAGE/HYSTEROSCOPY WITH MYOSURE;  Surgeon: Salvadore Dom, MD;  Location: [facility];  Service: Gynecology;  Laterality: N/A;  . DILATION AND CURETTAGE OF UTERUS  08/1996   w/ suction for missed ab  . ENDOMETRIAL ABLATION  2002   "per pt cryo endometrial ablation"  . LAPAROSCOPIC CHOLECYSTECTOMY  12/2010  . LUMBAR LAMINECTOMY  03/2000;   RE-DO  12/ 2001   L4-5  . LUMBAR LAMINECTOMY  2001  . MOHS SURGERY  2016   left side of nose   . TUBAL LIGATION  09/1999  . TUBAL LIGATION  01/1999    Current Outpatient Medications  Medication Sig Dispense Refill  . Ascorbic Acid (VITAMIN C) 500 MG CAPS     .  aspirin EC 81 MG tablet Take 81 mg by mouth daily.    . Calcium Citrate-Vitamin D (CALCIUM CITRATE + D3 PO) Take by mouth.    . clonazePAM (KLONOPIN) 1 MG tablet TAKE 1 TABLET (1 MG TOTAL) BY MOUTH 3 (THREE) TIMES DAILY AS NEEDED FOR ANXIETY. 90 tablet 5  . clonazePAM (KLONOPIN) 1 MG tablet Take 1 tablet (1 mg total) by mouth 3 (three) times daily as needed for anxiety. 90 tablet 5  . estradiol (ESTRACE) 1 MG tablet Take 1 tablet (1 mg total) by mouth daily. 90 tablet 3  . Garlic 4174 MG CAPS Take 1,000 mg by mouth daily.    Marland Kitchen KRILL OIL PO Take 1 capsule by mouth daily. 300 MG    . lisinopril (ZESTRIL) 10 MG tablet TAKE 1 TABLET BY MOUTH EVERY DAY IN THE EVENING 90 tablet 1  .  medroxyPROGESTERone (PROVERA) 2.5 MG tablet Take 1 tablet (2.5 mg total) by mouth every evening. 90 tablet 3  . Melatonin 10 MG CAPS Take 10 mg by mouth daily as needed.    . Misc Natural Products (GLUCOSAMINE CHOND COMPLEX/MSM) TABS     . Multiple Vitamin (MULTIVITAMIN WITH MINERALS) TABS tablet Take 1 tablet by mouth daily.    . Multiple Vitamins-Minerals (PRESERVISION AREDS 2 PO) Take 1 capsule by mouth daily.    . Red Yeast Rice 600 MG TABS Take 600 mg by mouth daily.    . Turmeric (QC TUMERIC COMPLEX) 500 MG CAPS Take 500 mg by mouth daily.     No current facility-administered medications for this visit.    Family History  Problem Relation Age of Onset  . Rheumatic fever Mother 47       Rheumatic heart disease - she died in childbirth;  . Early death Mother   . Hearing loss Mother   . Heart disease Mother   . Heart attack Mother   . Hypertension Father   . COPD Father        Also paternal uncle  . Alcohol abuse Father   . Kidney disease Father   . Polycythemia Sister   . Depression Sister   . Coronary artery disease Paternal Grandmother   . Heart attack Paternal Grandmother   . Heart disease Paternal Grandmother   . Depression Sister   . Lung cancer Paternal Uncle        As well as 3 maternal aunts  . Alcohol abuse Maternal Grandfather   . COPD Maternal Grandfather   . Diabetes Maternal Grandfather   . Stroke Maternal Grandfather   . Alcohol abuse Paternal Grandfather   . Mental illness Paternal Grandfather   . Lung cancer Maternal Aunt   . Brain cancer Maternal Aunt     Review of Systems  All other systems reviewed and are negative.   Exam:   BP 110/62   Pulse (!) 53   Temp 98 F (36.7 C)   Ht 5\' 5"  (1.651 m)   Wt 170 lb (77.1 kg)   SpO2 99%   BMI 28.29 kg/m   Weight change: @WEIGHTCHANGE @ Height:   Height: 5\' 5"  (165.1 cm)  Ht Readings from Last 3 Encounters:  06/20/20 5\' 5"  (1.651 m)  02/03/20 5\' 5"  (1.651 m)  06/20/19 5\' 5"  (1.651 m)    General  appearance: alert, cooperative and appears stated age Head: Normocephalic, without obvious abnormality, atraumatic Neck: no adenopathy, supple, symmetrical, trachea midline and thyroid normal to inspection and palpation Lungs: clear to auscultation bilaterally Cardiovascular: regular  rate and rhythm Breasts: normal appearance, no masses or tenderness Abdomen: soft, non-tender; non distended,  no masses,  no organomegaly Extremities: extremities normal, atraumatic, no cyanosis or edema Skin: Skin color, texture, turgor normal. No rashes or lesions Lymph nodes: Cervical, supraclavicular, and axillary nodes normal. No abnormal inguinal nodes palpated Neurologic: Grossly normal   Pelvic: External genitalia:  no lesions              Urethra:  normal appearing urethra with no masses, tenderness or lesions              Bartholins and Skenes: normal                 Vagina: normal appearing vagina with normal color and discharge, no lesions              Cervix: no lesions               Bimanual Exam:  Uterus:  normal size, contour, position, consistency, mobility, non-tender              Adnexa: no mass, fullness, tenderness               Rectovaginal: Confirms               Anus:  normal sphincter tone, no lesions  Terence Lux chaperoned for the exam.  A:  Well Woman with normal exam  On HRT, risks reviewed, wants to continue  Anxiety, on klonopin 3 x a day. We discussed trying an SSRI, she will discuss with Dr Jerline Pain    P:   No pap this year  Mammogram and colonoscopy UTD  Doesn't need a dexa  Continue HRT, aware of risks  Discussed breast self exam  Discussed calcium and vit D intake

## 2020-06-20 ENCOUNTER — Ambulatory Visit (INDEPENDENT_AMBULATORY_CARE_PROVIDER_SITE_OTHER): Payer: BC Managed Care – PPO | Admitting: Obstetrics and Gynecology

## 2020-06-20 ENCOUNTER — Encounter: Payer: Self-pay | Admitting: Obstetrics and Gynecology

## 2020-06-20 VITALS — BP 110/62 | HR 53 | Temp 98.0°F | Ht 65.0 in | Wt 170.0 lb

## 2020-06-20 DIAGNOSIS — Z79890 Hormone replacement therapy (postmenopausal): Secondary | ICD-10-CM

## 2020-06-20 DIAGNOSIS — Z01419 Encounter for gynecological examination (general) (routine) without abnormal findings: Secondary | ICD-10-CM | POA: Diagnosis not present

## 2020-06-20 DIAGNOSIS — F419 Anxiety disorder, unspecified: Secondary | ICD-10-CM | POA: Diagnosis not present

## 2020-06-20 MED ORDER — ESTRADIOL 1 MG PO TABS: 1.0000 mg | ORAL_TABLET | Freq: Every day | ORAL | 3 refills | Status: DC

## 2020-06-20 MED ORDER — MEDROXYPROGESTERONE ACETATE 2.5 MG PO TABS: 2.5000 mg | ORAL_TABLET | Freq: Every evening | ORAL | 3 refills | Status: DC

## 2020-06-20 NOTE — Patient Instructions (Signed)

## 2020-09-18 ENCOUNTER — Other Ambulatory Visit: Payer: Self-pay | Admitting: Family Medicine

## 2020-09-18 NOTE — Telephone Encounter (Signed)
Please ask patient to schedule appointment soon.  Rx sent in.  Alice Hodges. Jerline Pain, MD 09/18/2020 9:18 AM

## 2020-09-30 ENCOUNTER — Encounter: Payer: Self-pay | Admitting: Family Medicine

## 2020-10-02 ENCOUNTER — Ambulatory Visit: Payer: BC Managed Care – PPO

## 2020-10-06 ENCOUNTER — Encounter: Payer: Self-pay | Admitting: Family Medicine

## 2020-10-13 ENCOUNTER — Other Ambulatory Visit: Payer: Self-pay | Admitting: Family Medicine

## 2020-11-17 ENCOUNTER — Other Ambulatory Visit: Payer: Self-pay | Admitting: Family Medicine

## 2020-11-19 NOTE — Telephone Encounter (Signed)
LAST APPOINTMENT DATE: 02/03/2020  NEXT APPOINTMENT DATE: Visit date not found    LAST REFILL: [id number]  QTY: 19

## 2021-01-06 ENCOUNTER — Encounter: Payer: Self-pay | Admitting: Family Medicine

## 2021-01-06 ENCOUNTER — Encounter: Payer: Self-pay | Admitting: Obstetrics and Gynecology

## 2021-01-20 ENCOUNTER — Other Ambulatory Visit: Payer: Self-pay | Admitting: Family Medicine

## 2021-01-22 ENCOUNTER — Other Ambulatory Visit: Payer: Self-pay | Admitting: Family Medicine

## 2021-01-22 MED ORDER — CLONAZEPAM 1 MG PO TABS: 1.0000 mg | ORAL_TABLET | Freq: Three times a day (TID) | ORAL | 5 refills | Status: DC | PRN

## 2021-02-10 ENCOUNTER — Encounter: Payer: Self-pay | Admitting: Obstetrics and Gynecology

## 2021-02-10 ENCOUNTER — Encounter: Payer: Self-pay | Admitting: Family Medicine

## 2021-02-13 NOTE — Telephone Encounter (Signed)
See note

## 2021-02-18 ENCOUNTER — Encounter: Payer: Self-pay | Admitting: Family Medicine

## 2021-03-12 NOTE — Telephone Encounter (Signed)
Per review of Epic, vaccines updated.   Encounter closed.

## 2021-04-01 ENCOUNTER — Other Ambulatory Visit: Payer: Self-pay | Admitting: Obstetrics and Gynecology

## 2021-04-01 DIAGNOSIS — Z1231 Encounter for screening mammogram for malignant neoplasm of breast: Secondary | ICD-10-CM

## 2021-04-09 ENCOUNTER — Other Ambulatory Visit: Payer: Self-pay | Admitting: Family Medicine

## 2021-04-26 ENCOUNTER — Other Ambulatory Visit: Payer: Self-pay

## 2021-04-26 ENCOUNTER — Other Ambulatory Visit (HOSPITAL_BASED_OUTPATIENT_CLINIC_OR_DEPARTMENT_OTHER): Payer: Self-pay

## 2021-04-26 ENCOUNTER — Ambulatory Visit: Payer: BC Managed Care – PPO | Attending: Internal Medicine

## 2021-04-26 DIAGNOSIS — Z23 Encounter for immunization: Secondary | ICD-10-CM

## 2021-04-26 MED ORDER — PFIZER-BIONT COVID-19 VAC-TRIS 30 MCG/0.3ML IM SUSP
INTRAMUSCULAR | 0 refills | Status: DC
  Filled 2021-04-26: qty 0.3, 1d supply, fill #0

## 2021-04-26 NOTE — Progress Notes (Signed)
   Covid-19 Vaccination Clinic  Name:  Alice Hodges    MRN: [id number] DOB: 04-22-1961  04/26/2021  [name] was observed post Covid-19 immunization for 15 minutes without incident. She was provided with Vaccine Information Sheet and instruction to access the V-Safe system.   [name] was instructed to call 911 with any severe reactions post vaccine: Marland Kitchen Difficulty breathing  . Swelling of face and throat  . A fast heartbeat  . A bad rash all over body  . Dizziness and weakness   Immunizations Administered    Name Date Dose VIS Date Route   PFIZER Comrnaty(Gray TOP) Covid-19 Vaccine 04/26/2021  3:38 PM 0.3 mL 12/06/2020 Intramuscular   Manufacturer: [employer]   Lot: [id number]   NDC: [phone]

## 2021-05-20 ENCOUNTER — Encounter: Payer: Self-pay | Admitting: Family Medicine

## 2021-05-20 ENCOUNTER — Encounter: Payer: BC Managed Care – PPO | Admitting: Family Medicine

## 2021-05-20 ENCOUNTER — Other Ambulatory Visit: Payer: Self-pay

## 2021-05-20 ENCOUNTER — Ambulatory Visit (INDEPENDENT_AMBULATORY_CARE_PROVIDER_SITE_OTHER): Payer: BC Managed Care – PPO | Admitting: Family Medicine

## 2021-05-20 ENCOUNTER — Ambulatory Visit
Admission: RE | Admit: 2021-05-20 | Discharge: 2021-05-20 | Disposition: A | Discharge status: Home / Self Care | Payer: BC Managed Care – PPO | Source: Ambulatory Visit

## 2021-05-20 VITALS — BP 136/86 | HR 57 | Temp 98.2°F | Ht 65.0 in | Wt 178.0 lb

## 2021-05-20 DIAGNOSIS — Z1231 Encounter for screening mammogram for malignant neoplasm of breast: Secondary | ICD-10-CM

## 2021-05-20 DIAGNOSIS — I10 Essential (primary) hypertension: Secondary | ICD-10-CM

## 2021-05-20 DIAGNOSIS — K5900 Constipation, unspecified: Secondary | ICD-10-CM | POA: Diagnosis not present

## 2021-05-20 DIAGNOSIS — F419 Anxiety disorder, unspecified: Secondary | ICD-10-CM | POA: Diagnosis not present

## 2021-05-20 DIAGNOSIS — Z6829 Body mass index (BMI) 29.0-29.9, adult: Secondary | ICD-10-CM

## 2021-05-20 DIAGNOSIS — E785 Hyperlipidemia, unspecified: Secondary | ICD-10-CM

## 2021-05-20 DIAGNOSIS — R739 Hyperglycemia, unspecified: Secondary | ICD-10-CM

## 2021-05-20 DIAGNOSIS — Z0001 Encounter for general adult medical examination with abnormal findings: Secondary | ICD-10-CM | POA: Diagnosis not present

## 2021-05-20 LAB — CBC
HCT: 40.2 % (ref 36.0–46.0)
Hemoglobin: 13.7 g/dL (ref 12.0–15.0)
MCHC: 34.2 g/dL (ref 30.0–36.0)
MCV: 87.5 fl (ref 78.0–100.0)
Platelets: 242 10*3/uL (ref 150.0–400.0)
RBC: 4.59 Mil/uL (ref 3.87–5.11)
RDW: 13.1 % (ref 11.5–15.5)
WBC: 7.1 10*3/uL (ref 4.0–10.5)

## 2021-05-20 LAB — LIPID PANEL
Cholesterol: 192 mg/dL (ref 0–200)
HDL: 53 mg/dL (ref >39.00)
LDL Cholesterol: 108 mg/dL — ABNORMAL HIGH (ref 0–99)
NonHDL: 138.89
Total CHOL/HDL Ratio: 4
Triglycerides: 156 mg/dL — ABNORMAL HIGH (ref 0.0–149.0)
VLDL: 31.2 mg/dL (ref 0.0–40.0)

## 2021-05-20 LAB — URINALYSIS
Bilirubin Urine: NEGATIVE
Hgb urine dipstick: NEGATIVE
Ketones, ur: NEGATIVE
Leukocytes,Ua: NEGATIVE
Nitrite: NEGATIVE
Specific Gravity, Urine: <=1.005 — AB (ref 1.000–1.030)
Total Protein, Urine: NEGATIVE
Urine Glucose: NEGATIVE
Urobilinogen, UA: 0.2 (ref 0.0–1.0)
pH: 6 (ref 5.0–8.0)

## 2021-05-20 LAB — COMPREHENSIVE METABOLIC PANEL
ALT: 19 U/L (ref 0–35)
AST: 23 U/L (ref 0–37)
Albumin: 4.2 g/dL (ref 3.5–5.2)
Alkaline Phosphatase: 50 U/L (ref 39–117)
BUN: 16 mg/dL (ref 6–23)
CO2: 25 mEq/L (ref 19–32)
Calcium: 9.6 mg/dL (ref 8.4–10.5)
Chloride: 105 mEq/L (ref 96–112)
Creatinine, Ser: 0.94 mg/dL (ref 0.40–1.20)
GFR: 66.13 mL/min (ref >60.00)
Glucose, Bld: 97 mg/dL (ref 70–99)
Potassium: 3.9 mEq/L (ref 3.5–5.1)
Sodium: 142 mEq/L (ref 135–145)
Total Bilirubin: 0.6 mg/dL (ref 0.2–1.2)
Total Protein: 7 g/dL (ref 6.0–8.3)

## 2021-05-20 LAB — HEMOGLOBIN A1C: Hgb A1c MFr Bld: 5.5 % (ref 4.6–6.5)

## 2021-05-20 LAB — TSH: TSH: 1.44 u[IU]/mL (ref 0.35–4.50)

## 2021-05-20 MED ORDER — ESCITALOPRAM OXALATE 10 MG PO TABS: 10.0000 mg | ORAL_TABLET | Freq: Every day | ORAL | 5 refills | Status: DC

## 2021-05-20 NOTE — Assessment & Plan Note (Addendum)
Symptoms have significantly worsened.  Mostly due to COVID stress and health of her husband.  We discussed treatment options.  She has done well on Lexapro in the past.  We will start 10 mg daily and she will check in with me in a couple of weeks.  Continue current dose of clonazepam 1 mg 3 times daily as needed.  She has done therapy in the past.  Does not wish to consider this further at this time though will let me know if she changes her mind.

## 2021-05-20 NOTE — Patient Instructions (Signed)
It was very nice to see you today!  Please start the Lexapro.  Send a message in a couple weeks to let me know how this is working for you.  You can take MiraLAX as needed to have 1 soft bowel movement daily.  We will check blood work today.  I will see back in a year for your next annual physical.  Come back to see me sooner if needed.  Take care, Dr Jerline Pain  PLEASE NOTE:  If you had any lab tests please let us know if you have not heard back within a few days. You may see your results on mychart before we have a chance to review them but we will give you a call once they are reviewed by [place]. If we ordered any referrals today, please let us know if you have not heard from their office within the next week.   Please try these tips to maintain a healthy lifestyle:   Eat at least 3 REAL meals and 1-2 snacks per day.  Aim for no more than 5 hours between eating.  If you eat breakfast, please do so within one hour of getting up.    Each meal should contain half fruits/vegetables, one quarter protein, and one quarter carbs (no bigger than a computer mouse)   Cut down on sweet beverages. This includes juice, soda, and sweet tea.     Drink at least 1 glass of water with each meal and aim for at least 8 glasses per day   Exercise at least 150 minutes every week.    Preventive Care 60-7 Years Old, Female Preventive care refers to lifestyle choices and visits with your health care provider that can promote health and wellness. This includes:  A yearly physical exam. This is also called an annual wellness visit.  Regular dental and eye exams.  Immunizations.  Screening for certain conditions.  Healthy lifestyle choices, such as: ? Eating a healthy diet. ? Getting regular exercise. ? Not using drugs or products that contain nicotine and tobacco. ? Limiting alcohol use. What can I expect for my preventive care visit? Physical exam Your health care provider will check  your:  Height and weight. These may be used to calculate your BMI (body mass index). BMI is a measurement that tells if you are at a healthy weight.  Heart rate and blood pressure.  Body temperature.  Skin for abnormal spots. Counseling Your health care provider may ask you questions about your:  Past medical problems.  Family's medical history.  Alcohol, tobacco, and drug use.  Emotional well-being.  Home life and relationship well-being.  Sexual activity.  Diet, exercise, and sleep habits.  Work and work Statistician.  Access to firearms.  Method of birth control.  Menstrual cycle.  Pregnancy history. What immunizations do I need? Vaccines are usually given at various ages, according to a schedule. Your health care provider will recommend vaccines for you based on your age, medical history, and lifestyle or other factors, such as travel or where you work.   What tests do I need? Blood tests  Lipid and cholesterol levels. These may be checked every 5 years, or more often if you are over 69 years old.  Hepatitis C test.  Hepatitis B test. Screening  Lung cancer screening. You may have this screening every year starting at age 35 if you have a 30-pack-year history of smoking and currently smoke or have quit within the past 15 years.  Colorectal cancer screening. ?  All adults should have this screening starting at age 15 and continuing until age 66. ? Your health care provider may recommend screening at age 60 if you are at increased risk. ? You will have tests every 1-10 years, depending on your results and the type of screening test.  Diabetes screening. ? This is done by checking your blood sugar (glucose) after you have not eaten for a while (fasting). ? You may have this done every 1-3 years.  Mammogram. ? This may be done every 1-2 years. ? Talk with your health care provider about when you should start having regular mammograms. This may depend on  whether you have a family history of breast cancer.  BRCA-related cancer screening. This may be done if you have a family history of breast, ovarian, tubal, or peritoneal cancers.  Pelvic exam and Pap test. ? This may be done every 3 years starting at age 30. ? Starting at age 29, this may be done every 5 years if you have a Pap test in combination with an HPV test. Other tests  STD (sexually transmitted disease) testing, if you are at risk.  Bone density scan. This is done to screen for osteoporosis. You may have this scan if you are at high risk for osteoporosis. Talk with your health care provider about your test results, treatment options, and if necessary, the need for more tests. Follow these instructions at home: Eating and drinking  Eat a diet that includes fresh fruits and vegetables, whole grains, lean protein, and low-fat dairy products.  Take vitamin and mineral supplements as recommended by your health care provider.  Do not drink alcohol if: ? Your health care provider tells you not to drink. ? You are pregnant, may be pregnant, or are planning to become pregnant.  If you drink alcohol: ? Limit how much you have to 0-1 drink a day. ? Be aware of how much alcohol is in your drink. In the [place] one drink equals one 12 oz bottle of beer (355 mL), one 5 oz glass of wine (148 mL), or one 1 oz glass of hard liquor (44 mL).   Lifestyle  Take daily care of your teeth and gums. Brush your teeth every morning and night with fluoride toothpaste. Floss one time each day.  Stay active. Exercise for at least 30 minutes 5 or more days each week.  Do not use any products that contain nicotine or tobacco, such as cigarettes, e-cigarettes, and chewing tobacco. If you need help quitting, ask your health care provider.  Do not use drugs.  If you are sexually active, practice safe sex. Use a condom or other form of protection to prevent STIs (sexually transmitted infections).  If you  do not wish to become pregnant, use a form of birth control. If you plan to become pregnant, see your health care provider for a prepregnancy visit.  If told by your health care provider, take low-dose aspirin daily starting at age 25.  Find healthy ways to cope with stress, such as: ? Meditation, yoga, or listening to music. ? Journaling. ? Talking to a trusted person. ? Spending time with friends and family. Safety  Always wear your seat belt while driving or riding in a vehicle.  Do not drive: ? If you have been drinking alcohol. Do not ride with someone who has been drinking. ? When you are tired or distracted. ? While texting.  Wear a helmet and other protective equipment during sports activities.  If you have firearms in your house, make sure you follow all gun safety procedures. What's next?  Visit your health care provider once a year for an annual wellness visit.  Ask your health care provider how often you should have your eyes and teeth checked.  Stay up to date on all vaccines. This information is not intended to replace advice given to you by your health care provider. Make sure you discuss any questions you have with your health care provider. Document Revised: 09/18/2020 Document Reviewed: 08/26/2018 Elsevier Patient Education  2021 [employer].

## 2021-05-20 NOTE — Progress Notes (Signed)
Chief Complaint:  Alice Hodges is a 60 y.o. female who presents today for her annual comprehensive physical exam.    Assessment/Plan:  Chronic Problems Addressed Today: Anxiety Symptoms have significantly worsened.  Mostly due to COVID stress and health of her husband.  We discussed treatment options.  She has done well on Lexapro in the past.  We will start 10 mg daily and she will check in with me in a couple of weeks.  Continue current dose of clonazepam 1 mg 3 times daily as needed.  She has done therapy in the past.  Does not wish to consider this further at this time though will let me know if she changes her mind.  Essential hypertension At goal on lisinopril 10 mg daily.  Will check labs.  Dyslipidemia Check lipid panel.  Continue lifestyle modifications.  Constipation No red flags.  Recommended daily MiraLAX as needed to have 1 soft bowel movement daily.  Body mass index is 29.62 kg/m. / Overweight  BMI Metric Follow Up - 05/20/21 1146      BMI Metric Follow Up-Please document annually   BMI Metric Follow Up Education provided           Preventative Healthcare: Check labs today.  She will be checking with insurance about shingles vaccines.  Will be getting Pap smear later this year.  Mammogram later today.  Due for colon cancer screening in 3 years.  Patient Counseling(The following topics were reviewed and/or handout was given):  -Nutrition: Stressed importance of moderation in sodium/caffeine intake, saturated fat and cholesterol, caloric balance, sufficient intake of fresh fruits, vegetables, and fiber.  -Stressed the importance of regular exercise.   -Substance Abuse: Discussed cessation/primary prevention of tobacco, alcohol, or other drug use; driving or other dangerous activities under the influence; availability of treatment for abuse.   -Injury prevention: Discussed safety belts, safety helmets, smoke detector, smoking near bedding or upholstery.    -Sexuality: Discussed sexually transmitted diseases, partner selection, use of condoms, avoidance of unintended pregnancy and contraceptive alternatives.   -Dental health: Discussed importance of regular tooth brushing, flossing, and dental visits.  -Health maintenance and immunizations reviewed. Please refer to Health maintenance section.  Return to care in 1 year for next preventative visit.     Subjective:  HPI:  She has no acute complaints today.   See A/P for status of chronic conditions.  Lifestyle Diet: Balanced. Drinks plenty of water. Gets a lot of fiber and fruits and vegetables.  Exercise: Likes walking.   Depression screen [facility] 2/9 05/20/2021  Decreased Interest 0  Down, Depressed, Hopeless 2  PHQ - 2 Score 2  Altered sleeping 1  Tired, decreased energy 1  Change in appetite 2  Feeling bad or failure about yourself  0  Trouble concentrating 0  Moving slowly or fidgety/restless 0  Suicidal thoughts 0  PHQ-9 Score 6  Difficult doing work/chores Somewhat difficult    Health Maintenance Due  Topic Date Due  . HIV Screening  Never done  . PAP SMEAR-Modifier  07/28/2020  . MAMMOGRAM  05/16/2021     ROS: Per HPI, otherwise a complete review of systems was negative.   PMH:  The following were reviewed and entered/updated in epic: Past Medical History:  Diagnosis Date  . Abnormal uterine bleeding   . Allergic rhinitis   . Anxiety   . Chronic constipation    03-29-2018 per pt hx fecal retention  . Depression   . GERD (gastroesophageal reflux disease)   .  Hiatal hernia   . History of basal cell carcinoma (BCC) excision    2016  nasal area  . Hyperlipidemia   . Hypertension   . Infertility, female   . OA (osteoarthritis)    left shoulder(spur), left hip(spur), spine  . PVC's (premature ventricular contractions)    03-29-2018  per pt since age 63s, hx holter monitor showed pvc's, per pt has never caused sob or chest pain  . Restless leg syndrome   .  Seasonal allergies   . Wears glasses    Patient Active Problem List   Diagnosis Date Noted  . Constipation 05/20/2021  . GERD (gastroesophageal reflux disease) 03/15/2019  . Gout 03/15/2019  . Menopause 04/05/2018  . Anxiety 02/15/2018  . Skin lesions 12/03/2017  . Restless leg syndrome 11/06/2017  . Chest pain with moderate risk for cardiac etiology 04/30/2017  . Abnormal finding on EKG 04/30/2017  . Dyslipidemia 04/30/2017  . Essential hypertension 04/30/2017  . AC joint arthropathy 10/29/2016  . 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  . Allergic rhinitis 11/24/2011  . Degenerative disc disease, lumbar 07/31/2009   Past Surgical History:  Procedure Laterality Date  . CHOLECYSTECTOMY  01/01/2011  . DILATATION & CURETTAGE/HYSTEROSCOPY WITH MYOSURE N/A 04/05/2018   Procedure: DILATATION & CURETTAGE/HYSTEROSCOPY WITH MYOSURE;  Surgeon: Salvadore Dom, MD;  Location: [facility];  Service: Gynecology;  Laterality: N/A;  . DILATION AND CURETTAGE OF UTERUS  08/1996   w/ suction for missed ab  . ENDOMETRIAL ABLATION  2002   "per pt cryo endometrial ablation"  . LAPAROSCOPIC CHOLECYSTECTOMY  12/2010  . LUMBAR LAMINECTOMY  03/2000;   RE-DO  12/ 2001   L4-5  . LUMBAR LAMINECTOMY  2001  . MOHS SURGERY  2016   left side of nose   . TUBAL LIGATION  09/1999  . TUBAL LIGATION  01/1999    Family History  Problem Relation Age of Onset  . Rheumatic fever Mother 63       Rheumatic heart disease - she died in childbirth;  . Early death Mother   . Hearing loss Mother   . Heart disease Mother   . Heart attack Mother   . Hypertension Father   . COPD Father        Also paternal uncle  . Alcohol abuse Father   . Kidney disease Father   . Polycythemia Sister   . Depression Sister   . Coronary artery disease Paternal Grandmother   . Heart attack Paternal Grandmother   . Heart disease Paternal Grandmother   . Depression Sister   . Lung cancer Paternal Uncle        As  well as 3 maternal aunts  . Alcohol abuse Maternal Grandfather   . COPD Maternal Grandfather   . Diabetes Maternal Grandfather   . Stroke Maternal Grandfather   . Alcohol abuse Paternal Grandfather   . Mental illness Paternal Grandfather   . Lung cancer Maternal Aunt   . Brain cancer Maternal Aunt     Medications- reviewed and updated Current Outpatient Medications  Medication Sig Dispense Refill  . escitalopram (LEXAPRO) 10 MG tablet Take 1 tablet (10 mg total) by mouth daily. 30 tablet 5  . Loratadine (CLARITIN) 10 MG CAPS Take by mouth.    . melatonin 5 MG TABS     . Ascorbic Acid (VITAMIN C) 500 MG CAPS     . aspirin EC 81 MG tablet Take 81 mg by mouth daily.    . Calcium Citrate-Vitamin D (  CALCIUM CITRATE + D3 PO) Take by mouth.    . clonazePAM (KLONOPIN) 1 MG tablet TAKE 1 TABLET BY MOUTH THREE TIMES A DAY AS NEEDED FOR ANXIETY 30 tablet 0  . clonazePAM (KLONOPIN) 1 MG tablet Take 1 tablet (1 mg total) by mouth 3 (three) times daily as needed for anxiety. 90 tablet 5  . estradiol (ESTRACE) 1 MG tablet Take 1 tablet (1 mg total) by mouth daily. 90 tablet 3  . Garlic 9417 MG CAPS Take 1,000 mg by mouth daily.    Marland Kitchen KRILL OIL PO Take 1 capsule by mouth daily. 300 MG    . lisinopril (ZESTRIL) 10 MG tablet TAKE 1 TABLET BY MOUTH EVERY DAY IN THE EVENING 90 tablet 1  . Misc Natural Products (GLUCOSAMINE CHOND COMPLEX/MSM) TABS     . Multiple Vitamin (MULTIVITAMIN WITH MINERALS) TABS tablet Take 1 tablet by mouth daily.    . Red Yeast Rice 600 MG TABS Take 600 mg by mouth daily.    . Turmeric (QC TUMERIC COMPLEX) 500 MG CAPS Take 500 mg by mouth daily.     No current facility-administered medications for this visit.    Allergies-reviewed and updated Allergies  Allergen Reactions  . Tape Rash    Social History   Socioeconomic History  . Marital status: Married    Spouse name: Not on file  . Number of children: Not on file  . Years of education: Not on file  . Highest  education level: Bachelor's degree (e.g., BA, AB, BS)  Occupational History  . Occupation: Self Employed  Tobacco Use  . Smoking status: Former Smoker    Packs/day: 0.50    Years: 15.00    Pack years: 7.50    Types: Cigarettes    Quit date: 04/28/2012    Years since quitting: 9.0  . Smokeless tobacco: Never Used  Vaping Use  . Vaping Use: Never used  Substance and Sexual Activity  . Alcohol use: Not Currently  . Drug use: No  . Sexual activity: Yes    Partners: Male    Birth control/protection: Surgical    Comment: Tubal Ligaton   Other Topics Concern  . Not on file  Social History Narrative   She and her husband Clair Gulling) recently moved from [facility] down to [place] for better job opportunities. 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 She is now ready to set herself back up working.      - second marriage. No children. Her current husband Clair Gulling) has children and grandchildren.Granddaughter just diagnosed with cancer   She is starting to try to go out and exercise with her husband walking on it routinely.   Social Determinants of Health   Financial Resource Strain: Not on file  Food Insecurity: Not on file  Transportation Needs: Not on file  Physical Activity: Not on file  Stress: Not on file  Social Connections: Not on file        Objective:  Physical Exam: BP 136/86   Pulse (!) 57   Temp 98.2 F (36.8 C) (Temporal)   Ht 5\' 5"  (1.651 m)   Wt 178 lb (80.7 kg)   SpO2 97%   BMI 29.62 kg/m   Body mass index is 29.62 kg/m. Wt Readings from Last 3 Encounters:  05/20/21 178 lb (80.7 kg)  06/20/20 170 lb (77.1 kg)  02/03/20 169 lb (76.7 kg)   Gen: NAD, resting comfortably HEENT: TMs normal bilaterally. OP clear. No thyromegaly noted.  CV: RRR with no murmurs appreciated Pulm: NWOB, CTAB with no crackles, wheezes, or rhonchi GI: Normal  bowel sounds present. Soft, Nontender, Nondistended. MSK: no edema, cyanosis, or clubbing noted Skin: warm, dry Neuro: CN2-12 grossly intact. Strength 5/5 in upper and lower extremities. Reflexes symmetric and intact bilaterally.  Psych: Normal affect and thought content     Ashland Wiseman M. Jerline Pain, MD 05/20/2021 11:48 AM

## 2021-05-20 NOTE — Assessment & Plan Note (Signed)
At goal on lisinopril 10 mg daily.  Will check labs.

## 2021-05-20 NOTE — Assessment & Plan Note (Signed)
Check lipid panel.  Continue lifestyle modifications. 

## 2021-05-20 NOTE — Assessment & Plan Note (Signed)
No red flags.  Recommended daily MiraLAX as needed to have 1 soft bowel movement daily.

## 2021-05-22 NOTE — Progress Notes (Signed)
Please inform patient of the following:  Her labs are all stable.  Do not need to make any adjustments to her treatment plan at this time.  We can recheck in a year.

## 2021-05-23 ENCOUNTER — Other Ambulatory Visit: Payer: Self-pay | Admitting: Obstetrics and Gynecology

## 2021-05-23 DIAGNOSIS — R928 Other abnormal and inconclusive findings on diagnostic imaging of breast: Secondary | ICD-10-CM

## 2021-05-29 DIAGNOSIS — R6889 Other general symptoms and signs: Secondary | ICD-10-CM | POA: Insufficient documentation

## 2021-06-04 ENCOUNTER — Encounter: Payer: Self-pay | Admitting: Family Medicine

## 2021-06-12 ENCOUNTER — Other Ambulatory Visit: Payer: Self-pay | Admitting: Family Medicine

## 2021-06-12 ENCOUNTER — Other Ambulatory Visit: Payer: Self-pay | Admitting: Obstetrics and Gynecology

## 2021-06-12 NOTE — Telephone Encounter (Signed)
Medication refill request: estrace Last AEX:  06-20-20 JJ  Next AEX: 07-22-21 Last MMG (if hormonal medication request): 05-20-21 BIRADS 0, scheduled for additional imaging 06-13-21  Refill authorized: Today, please advise.   Medication refill request: provera Refill authorized: Today, please advise.  Medications pended for #30, 0RF. Please refill if appropriate.

## 2021-06-13 ENCOUNTER — Other Ambulatory Visit: Payer: Self-pay

## 2021-06-13 ENCOUNTER — Ambulatory Visit
Admission: RE | Admit: 2021-06-13 | Discharge: 2021-06-13 | Disposition: A | Discharge status: Home / Self Care | Payer: BC Managed Care – PPO | Source: Ambulatory Visit | Attending: Obstetrics and Gynecology | Admitting: Obstetrics and Gynecology

## 2021-06-13 ENCOUNTER — Ambulatory Visit: Payer: BC Managed Care – PPO

## 2021-06-13 DIAGNOSIS — R928 Other abnormal and inconclusive findings on diagnostic imaging of breast: Secondary | ICD-10-CM

## 2021-06-14 ENCOUNTER — Telehealth: Payer: Self-pay | Admitting: *Deleted

## 2021-06-14 MED ORDER — ESTRADIOL 1 MG PO TABS: 1.0000 mg | ORAL_TABLET | Freq: Every day | ORAL | 0 refills | Status: DC

## 2021-06-14 MED ORDER — MEDROXYPROGESTERONE ACETATE 2.5 MG PO TABS: 2.5000 mg | ORAL_TABLET | Freq: Every day | ORAL | 0 refills | Status: DC

## 2021-06-14 NOTE — Telephone Encounter (Signed)
Patient has annual exam 07/22/21,needs refills on HRT. Rx sent. Mammogram done 05/20/21

## 2021-06-26 ENCOUNTER — Ambulatory Visit: Payer: BC Managed Care – PPO | Admitting: Obstetrics and Gynecology

## 2021-07-22 ENCOUNTER — Other Ambulatory Visit: Payer: Self-pay

## 2021-07-22 ENCOUNTER — Encounter: Payer: Self-pay | Admitting: Obstetrics and Gynecology

## 2021-07-22 ENCOUNTER — Other Ambulatory Visit (HOSPITAL_COMMUNITY)
Admission: RE | Admit: 2021-07-22 | Discharge: 2021-07-22 | Disposition: A | Discharge status: Home / Self Care | Payer: BC Managed Care – PPO | Source: Ambulatory Visit | Attending: Obstetrics and Gynecology | Admitting: Obstetrics and Gynecology

## 2021-07-22 ENCOUNTER — Ambulatory Visit (INDEPENDENT_AMBULATORY_CARE_PROVIDER_SITE_OTHER): Payer: BC Managed Care – PPO | Admitting: Obstetrics and Gynecology

## 2021-07-22 VITALS — BP 124/66 | HR 55 | Ht 65.5 in | Wt 175.0 lb

## 2021-07-22 DIAGNOSIS — Z01419 Encounter for gynecological examination (general) (routine) without abnormal findings: Secondary | ICD-10-CM

## 2021-07-22 DIAGNOSIS — Z124 Encounter for screening for malignant neoplasm of cervix: Secondary | ICD-10-CM | POA: Insufficient documentation

## 2021-07-22 DIAGNOSIS — Z79890 Hormone replacement therapy (postmenopausal): Secondary | ICD-10-CM | POA: Diagnosis not present

## 2021-07-22 MED ORDER — ESTRADIOL 1 MG PO TABS: 1.0000 mg | ORAL_TABLET | Freq: Every day | ORAL | 3 refills | Status: DC

## 2021-07-22 MED ORDER — MEDROXYPROGESTERONE ACETATE 2.5 MG PO TABS: 2.5000 mg | ORAL_TABLET | Freq: Every day | ORAL | 3 refills | Status: DC

## 2021-07-22 NOTE — Patient Instructions (Signed)

## 2021-07-22 NOTE — Progress Notes (Signed)
60 y.o. [id number] Married White or Caucasian Not Hispanic or Latino female here for annual exam.     She is on oral HRT. She is aware of the risks and wants to continue. Can't use the patch secondary to tape allergy.   Husband had to have prostate resection, needed to have a foley for 6 months while waiting to get in for surgery. Now doing great.   Sexually active, no pain. No vaginal bleeding.   She had to have Mohs surgery on her nose in 2/22 for a basal cell cancer.   No LMP recorded. (Menstrual status: Perimenopausal).          Sexually active: Yes.    The current method of family planning is post menopausal status.    Exercising: Yes.     walking  Smoker:  no  Health Maintenance: Pap:  07-21-16 WNL History of abnormal Pap:  no MMG:  05/20/21 Mammo was incomplete following U/S of left breast Bi-rads 1 neg  BMD:    02-11-12 WNL  Colonoscopy:  01-10-14 WNL, f/u 10 years TDaP:  04/26/15 Gardasil: NA   reports that she quit smoking about 9 years ago. Her smoking use included cigarettes. She has a 7.50 pack-year smoking history. She has never used smokeless tobacco. She reports previous alcohol use. She reports that she does not use drugs. 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 Husband is a Writing Professor and writes Novels 2 grown [employer]. Granddaughters are 5 and 7, both with dicer one syndrome, both have had to have partial lung resections. It is a rare type of cancer. They are both doing well.  Past Medical History:  Diagnosis Date   Abnormal uterine bleeding    Allergic rhinitis    Anxiety    Chronic constipation    03-29-2018 per pt hx fecal retention   Depression    GERD (gastroesophageal reflux disease)    Hiatal hernia    History of basal cell carcinoma (BCC) excision    2016  nasal area   Hyperlipidemia    Hypertension    Infertility, female    OA (osteoarthritis)    left shoulder(spur), left hip(spur), spine   PVC's (premature ventricular  contractions)    03-29-2018  per pt since age 71s, hx holter monitor showed pvc's, per pt has never caused sob or chest pain   Restless leg syndrome    Seasonal allergies    Wears glasses     Past Surgical History:  Procedure Laterality Date   CHOLECYSTECTOMY  01/01/2011   DILATATION & CURETTAGE/HYSTEROSCOPY WITH MYOSURE N/A 04/05/2018   Procedure: DILATATION & CURETTAGE/HYSTEROSCOPY WITH MYOSURE;  Surgeon: Salvadore Dom, MD;  Location: [facility];  Service: Gynecology;  Laterality: N/A;   DILATION AND CURETTAGE OF UTERUS  08/1996   w/ suction for missed ab   ENDOMETRIAL ABLATION  2002   "per pt cryo endometrial ablation"   LAPAROSCOPIC CHOLECYSTECTOMY  12/2010   LUMBAR LAMINECTOMY  03/2000;   RE-DO  12/ 2001   L4-5   LUMBAR LAMINECTOMY  2001   MOHS SURGERY  2016   left side of nose    TUBAL LIGATION  09/1999   TUBAL LIGATION  01/1999    Current Outpatient Medications  Medication Sig Dispense Refill   Ascorbic Acid (VITAMIN C) 500 MG CAPS      aspirin EC 81 MG tablet Take 81 mg by mouth daily.     Calcium Citrate-Vitamin D (CALCIUM CITRATE + D3  PO) Take by mouth.     clonazePAM (KLONOPIN) 1 MG tablet TAKE 1 TABLET BY MOUTH THREE TIMES A DAY AS NEEDED FOR ANXIETY 30 tablet 0   escitalopram (LEXAPRO) 10 MG tablet TAKE 1 TABLET BY MOUTH EVERY DAY 90 tablet 2   estradiol (ESTRACE) 1 MG tablet Take 1 tablet (1 mg total) by mouth daily. 90 tablet 0   Garlic 123XX123 MG CAPS Take 1,000 mg by mouth daily.     KRILL OIL PO Take 1 capsule by mouth daily. 300 MG     lisinopril (ZESTRIL) 10 MG tablet TAKE 1 TABLET BY MOUTH EVERY DAY IN THE EVENING 90 tablet 1   Loratadine 10 MG CAPS Take by mouth.     medroxyPROGESTERone (PROVERA) 2.5 MG tablet Take 1 tablet (2.5 mg total) by mouth daily. 90 tablet 0   melatonin 5 MG TABS      Misc Natural Products (GLUCOSAMINE CHOND COMPLEX/MSM) TABS      Multiple Vitamin (MULTIVITAMIN WITH MINERALS) TABS tablet Take 1 tablet by mouth  daily.     Red Yeast Rice 600 MG TABS Take 600 mg by mouth daily.     Turmeric 500 MG CAPS Take 500 mg by mouth daily.     No current facility-administered medications for this visit.    Family History  Problem Relation Age of Onset   Rheumatic fever Mother 51       Rheumatic heart disease - she died in childbirth;   Early death Mother    Hearing loss Mother    Heart disease Mother    Heart attack Mother    Hypertension Father    COPD Father        Also paternal uncle   Alcohol abuse Father    Kidney disease Father    Polycythemia Sister    Depression Sister    Coronary artery disease Paternal Grandmother    Heart attack Paternal Grandmother    Heart disease Paternal Grandmother    Depression Sister    Lung cancer Paternal Uncle        As well as 3 maternal aunts   Alcohol abuse Maternal Grandfather    COPD Maternal Grandfather    Diabetes Maternal Grandfather    Stroke Maternal Grandfather    Alcohol abuse Paternal Grandfather    Mental illness Paternal Grandfather    Lung cancer Maternal Aunt    Brain cancer Maternal Aunt     Review of Systems  All other systems reviewed and are negative.  Exam:   BP 124/66   Pulse (!) 55   Ht 5' 5.5" (1.664 m)   Wt 175 lb (79.4 kg)   SpO2 97%   BMI 28.68 kg/m   Weight change: '@WEIGHTCHANGE'$ @ Height:   Height: 5' 5.5" (166.4 cm)  Ht Readings from Last 3 Encounters:  07/22/21 5' 5.5" (1.664 m)  05/20/21 '5\' 5"'$  (1.651 m)  06/20/20 '5\' 5"'$  (1.651 m)    General appearance: alert, cooperative and appears stated age Head: Normocephalic, without obvious abnormality, atraumatic Neck: no adenopathy, supple, symmetrical, trachea midline and thyroid normal to inspection and palpation Lungs: clear to auscultation bilaterally Cardiovascular: regular rate and rhythm Breasts: normal appearance, no masses or tenderness Abdomen: soft, non-tender; non distended,  no masses,  no organomegaly Extremities: extremities normal, atraumatic, no  cyanosis or edema Skin: Skin color, texture, turgor normal. No rashes or lesions Lymph nodes: Cervical, supraclavicular, and axillary nodes normal. No abnormal inguinal nodes palpated Neurologic: Grossly normal   Pelvic:  External genitalia:  no lesions              Urethra:  normal appearing urethra with no masses, tenderness or lesions              Bartholins and Skenes: normal                 Vagina: normal appearing vagina with normal color and discharge, no lesions              Cervix: no lesions               Bimanual Exam:  Uterus:  normal size, contour, position, consistency, mobility, non-tender and anteverted              Adnexa: no mass, fullness, tenderness               Rectovaginal: Confirms               Anus:  normal sphincter tone, no lesions  Karmen Bongo chaperoned for the exam.  1. Well woman exam Discussed breast self exam Discussed calcium and vit D intake Labs with primary Mammogram and colonoscopy UTD  2. Hormone replacement therapy (HRT) Wants to continue, understands risks - medroxyPROGESTERone (PROVERA) 2.5 MG tablet; Take 1 tablet (2.5 mg total) by mouth daily.  Dispense: 90 tablet; Refill: 3 - estradiol (ESTRACE) 1 MG tablet; Take 1 tablet (1 mg total) by mouth daily.  Dispense: 90 tablet; Refill: 3  3. Screening for cervical cancer - Cytology - PAP

## 2021-07-23 LAB — CYTOLOGY - PAP
Comment: NEGATIVE
Diagnosis: NEGATIVE
High risk HPV: NEGATIVE

## 2021-07-29 ENCOUNTER — Other Ambulatory Visit: Payer: Self-pay | Admitting: Family Medicine

## 2021-09-19 ENCOUNTER — Other Ambulatory Visit: Payer: Self-pay | Admitting: Family Medicine

## 2021-09-29 ENCOUNTER — Other Ambulatory Visit: Payer: Self-pay | Admitting: Family Medicine

## 2021-12-27 IMAGING — MG DIGITAL SCREENING BILAT W/ TOMO W/ CAD
8 series · 8 of 24 positions shown · non-contrast
Comparison: Previous exam(s).

CLINICAL DATA: Screening.

EXAM:
DIGITAL SCREENING BILATERAL MAMMOGRAM WITH TOMO AND CAD

[R CC synth-2D]
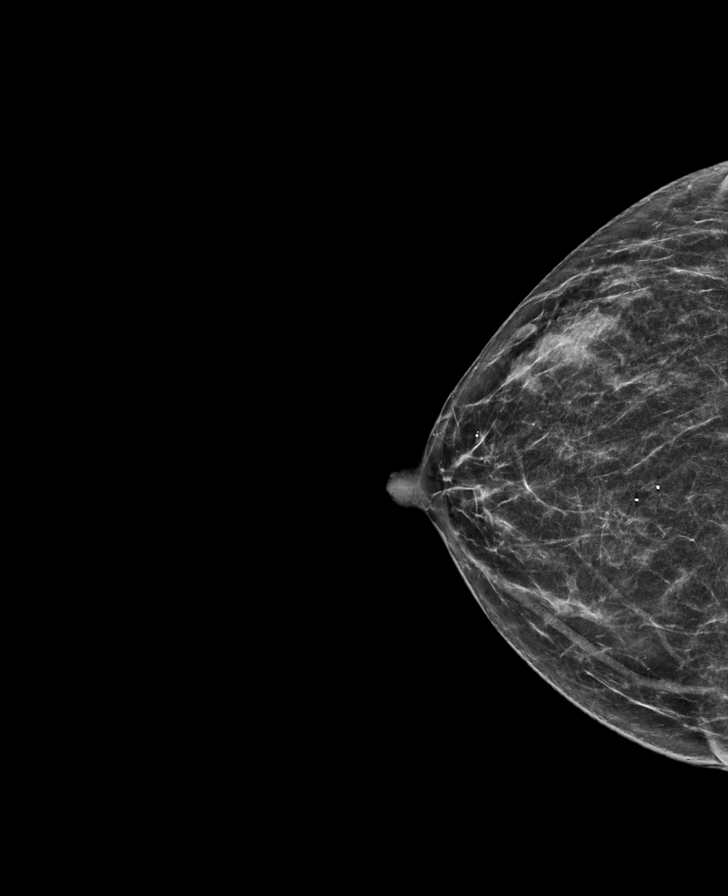

[R MLO synth-2D]
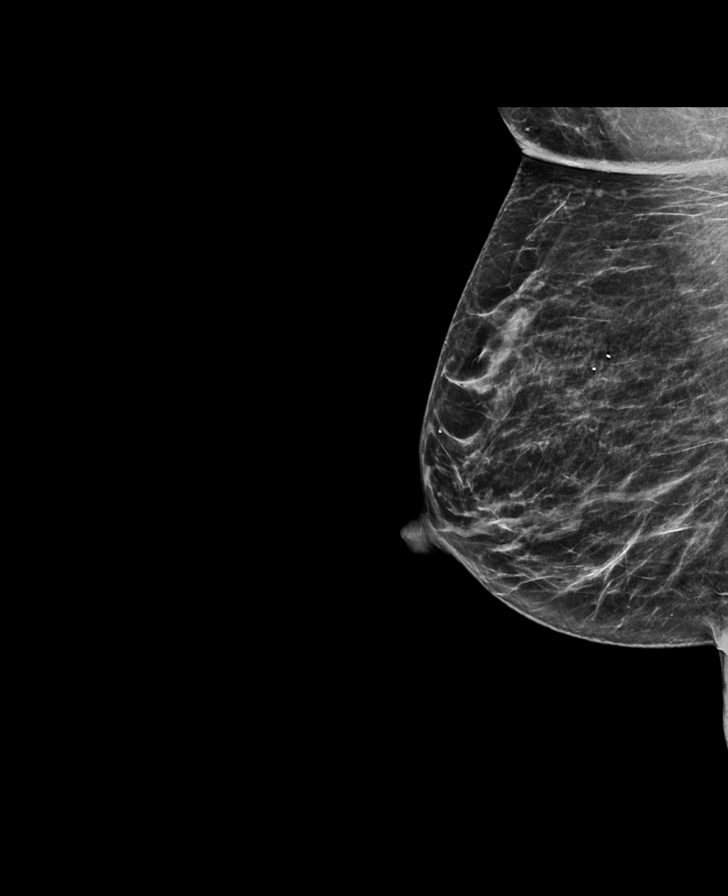

[L MLO synth-2D]
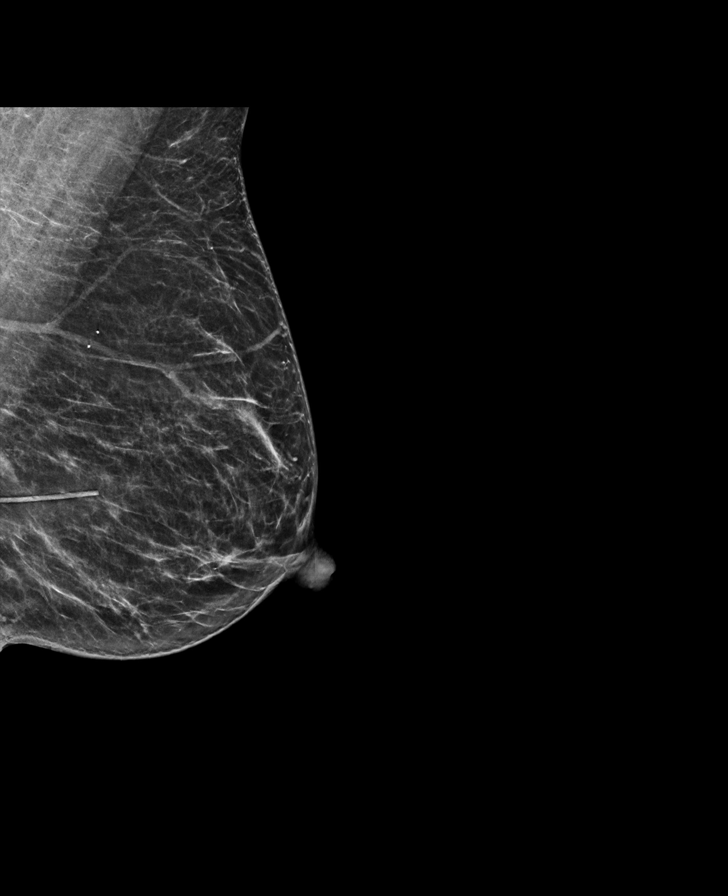

[L CC synth-2D]
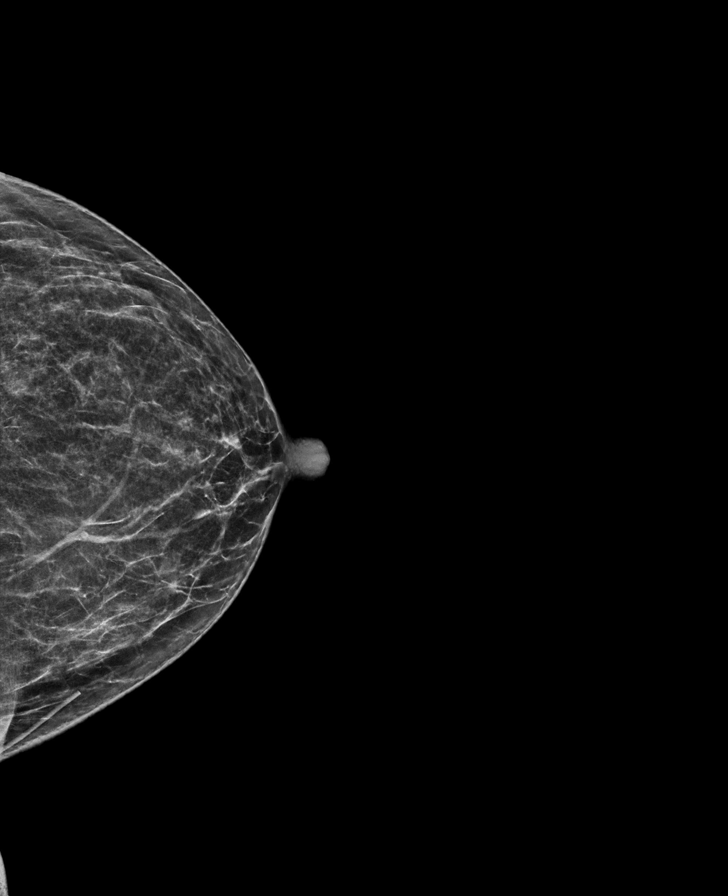

[L MLO tomo · tomo slice 29/58.0]
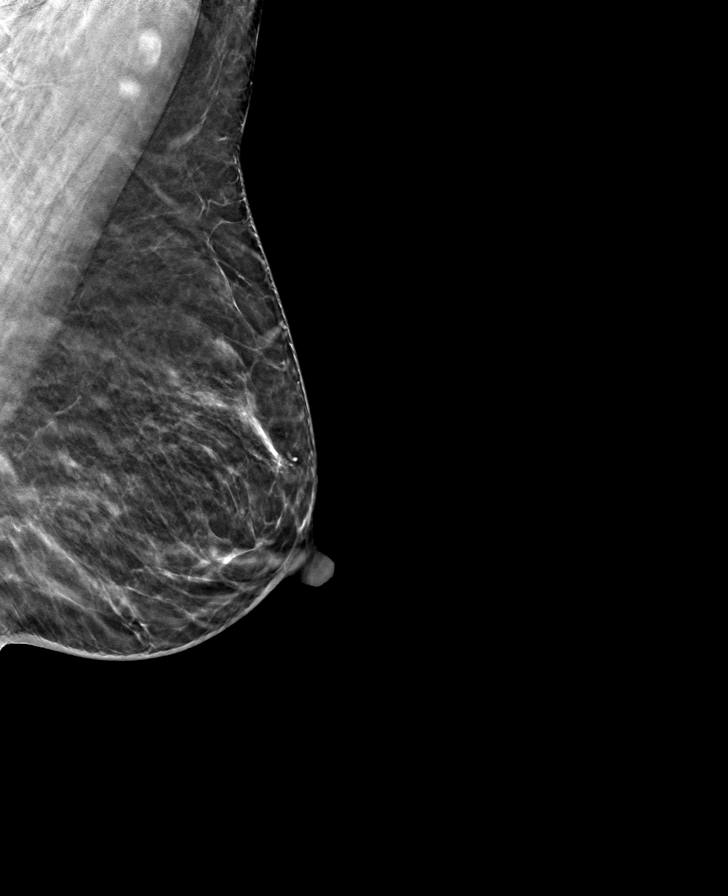

[R CC tomo · tomo slice 29/57.0]
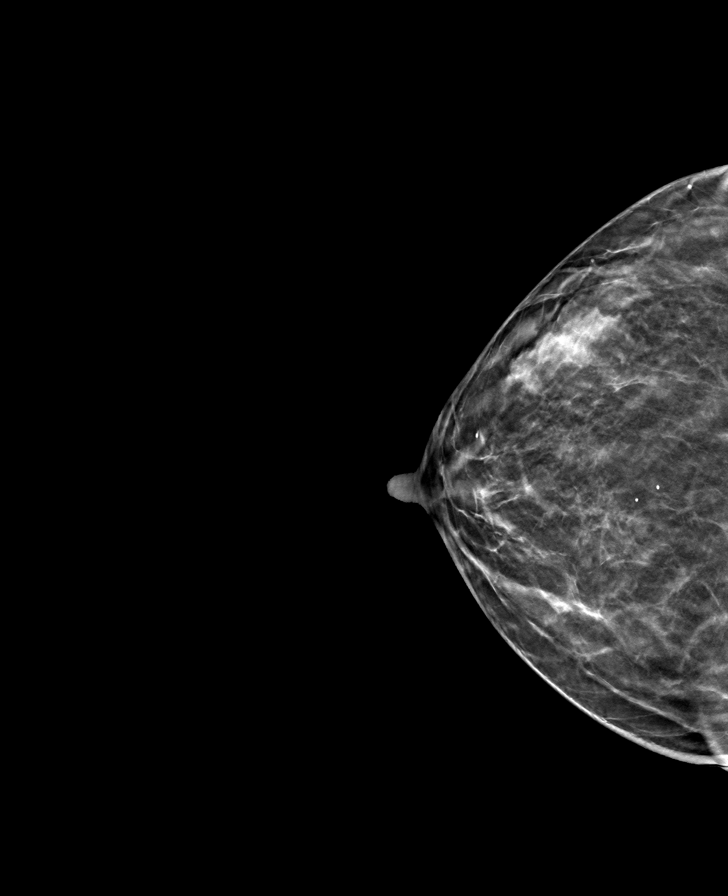

[R MLO tomo · tomo slice 31/60.0]
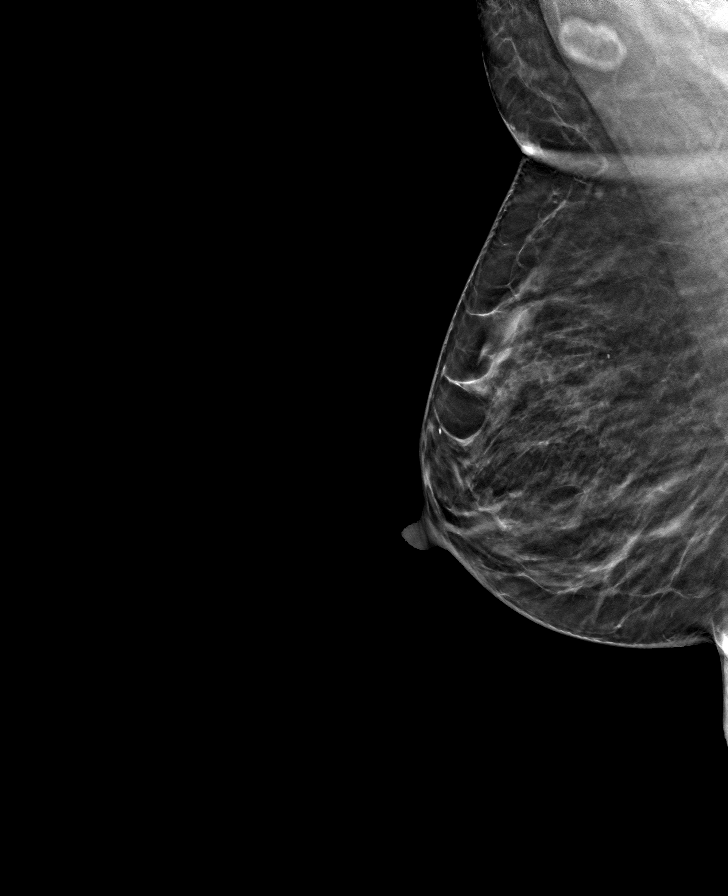

[L CC tomo · tomo slice 28/55.0]
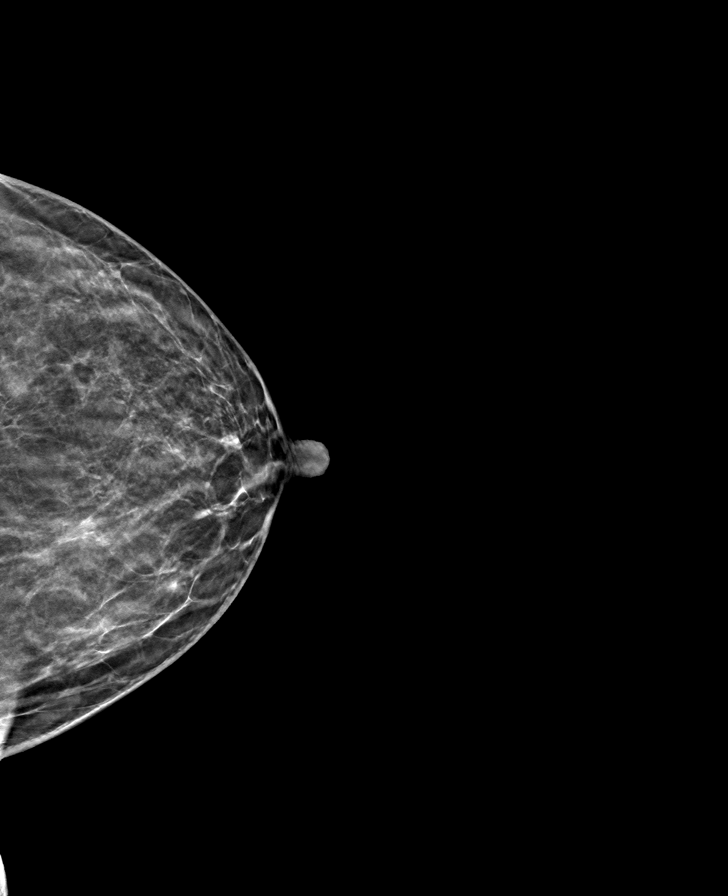

[8 of 24 positions shown; findings below may reference images not displayed]

ACR Breast Density Category b: There are scattered areas of
fibroglandular density.
FINDINGS: There are no findings suspicious for malignancy. Images were
processed with CAD.
IMPRESSION: No mammographic evidence of malignancy. A result letter of this
screening mammogram will be mailed directly to the patient.

RECOMMENDATION:
Screening mammogram in one year. (Code:[id number])

BI-RADS CATEGORY  1: Negative.

## 2022-02-04 ENCOUNTER — Encounter: Payer: Self-pay | Admitting: Family Medicine

## 2022-02-04 NOTE — Telephone Encounter (Signed)
Spoke with patient patient requesting in office visit aware PCP not in office today  Please schedule appointment with any open Provider

## 2022-02-05 ENCOUNTER — Ambulatory Visit (INDEPENDENT_AMBULATORY_CARE_PROVIDER_SITE_OTHER): Payer: BC Managed Care – PPO | Admitting: Family Medicine

## 2022-02-05 ENCOUNTER — Other Ambulatory Visit: Payer: Self-pay

## 2022-02-05 ENCOUNTER — Encounter: Payer: Self-pay | Admitting: Family Medicine

## 2022-02-05 VITALS — BP 150/80 | HR 75 | Temp 98.2°F | Ht 65.0 in | Wt 186.4 lb

## 2022-02-05 DIAGNOSIS — R531 Weakness: Secondary | ICD-10-CM

## 2022-02-05 DIAGNOSIS — J0140 Acute pansinusitis, unspecified: Secondary | ICD-10-CM

## 2022-02-05 DIAGNOSIS — R059 Cough, unspecified: Secondary | ICD-10-CM

## 2022-02-05 DIAGNOSIS — R0989 Other specified symptoms and signs involving the circulatory and respiratory systems: Secondary | ICD-10-CM | POA: Diagnosis not present

## 2022-02-05 LAB — POC COVID19 BINAXNOW: SARS Coronavirus 2 Ag: NEGATIVE

## 2022-02-05 MED ORDER — AMOXICILLIN-POT CLAVULANATE 875-125 MG PO TABS: 1.0000 | ORAL_TABLET | Freq: Two times a day (BID) | ORAL | 0 refills | Status: DC

## 2022-02-05 MED ORDER — PREDNISONE 20 MG PO TABS: 40.0000 mg | ORAL_TABLET | Freq: Every day | ORAL | 0 refills | Status: AC

## 2022-02-05 NOTE — Patient Instructions (Signed)
Meds have been sent the the pharmacy °You can take tylenol for pain/fevers °If worsening symptoms, let us know or go to the Emergency room  ° ° °

## 2022-02-05 NOTE — Progress Notes (Signed)
Subjective:     Patient ID: Alice Hodges, female    DOB: 09/29/1961, 61 y.o.   MRN: [id number]  Chief Complaint  Patient presents with   Fatigue   Cough    Started Friday night Has turned into productive cough Has taking Mucinex    Headache    Frontal and back of neck    Nasal Congestion    HPI Chief complaint: fatigue, cough,ha.  Has tested 3 days and covid all neg.  Symptom onset: 2/3 Pertinent positives: congestion, fatigue, ha, cough from drainage. A lot of nasal drainage.  Cough was worse yesterday.  H/o bronchitis w/seasonal changes. Fever 99.6 last pm. Loose stools.  Throbbing HA since this am.  Pertinent negatives: no vomiting.   Treatments tried: mucinex Vaccine status: UTD and boosted. Sick exposure: husb had URI.   Health Maintenance Due  Topic Date Due   HIV Screening  Never done   Zoster Vaccines- Shingrix (2 of 2) 08/02/2021    Past Medical History:  Diagnosis Date   Abnormal uterine bleeding    Allergic rhinitis    Anxiety    Chronic constipation    03-29-2018 per pt hx fecal retention   Depression    GERD (gastroesophageal reflux disease)    Hiatal hernia    History of basal cell carcinoma (BCC) excision    2016  nasal area   Hyperlipidemia    Hypertension    Infertility, female    OA (osteoarthritis)    left shoulder(spur), left hip(spur), spine   PVC's (premature ventricular contractions)    03-29-2018  per pt since age 7s, hx holter monitor showed pvc's, per pt has never caused sob or chest pain   Restless leg syndrome    Seasonal allergies    Wears glasses     Past Surgical History:  Procedure Laterality Date   CHOLECYSTECTOMY  01/01/2011   DILATATION & CURETTAGE/HYSTEROSCOPY WITH MYOSURE N/A 04/05/2018   Procedure: DILATATION & CURETTAGE/HYSTEROSCOPY WITH MYOSURE;  Surgeon: Salvadore Dom, MD;  Location: [facility];  Service: Gynecology;  Laterality: N/A;   DILATION AND CURETTAGE OF UTERUS  08/1996   w/  suction for missed ab   ENDOMETRIAL ABLATION  2002   "per pt cryo endometrial ablation"   LAPAROSCOPIC CHOLECYSTECTOMY  12/2010   LUMBAR LAMINECTOMY  03/2000;   RE-DO  12/ 2001   L4-5   LUMBAR LAMINECTOMY  2001   MOHS SURGERY  2016   left side of nose    TUBAL LIGATION  09/1999   TUBAL LIGATION  01/1999    Outpatient Medications Prior to Visit  Medication Sig Dispense Refill   Ascorbic Acid (VITAMIN C) 500 MG CAPS      aspirin EC 81 MG tablet Take 81 mg by mouth daily.     Calcium Citrate-Vitamin D (CALCIUM CITRATE + D3 PO) Take by mouth.     clonazePAM (KLONOPIN) 1 MG tablet TAKE 1 TABLET BY MOUTH THREE TIMES A DAY AS NEEDED FOR ANXIETY 90 tablet 5   escitalopram (LEXAPRO) 10 MG tablet TAKE 1 TABLET BY MOUTH EVERY DAY 90 tablet 2   estradiol (ESTRACE) 1 MG tablet Take 1 tablet (1 mg total) by mouth daily. 90 tablet 3   Garlic 0737 MG CAPS Take 1,000 mg by mouth daily.     KRILL OIL PO Take 1 capsule by mouth daily. 300 MG     lisinopril (ZESTRIL) 10 MG tablet TAKE 1 TABLET BY MOUTH EVERY DAY IN THE EVENING  90 tablet 1   Loratadine 10 MG CAPS Take by mouth.     medroxyPROGESTERone (PROVERA) 2.5 MG tablet Take 1 tablet (2.5 mg total) by mouth daily. 90 tablet 3   melatonin 5 MG TABS      Misc Natural Products (GLUCOSAMINE CHOND COMPLEX/MSM) TABS      Multiple Vitamin (MULTIVITAMIN WITH MINERALS) TABS tablet Take 1 tablet by mouth daily.     Red Yeast Rice 600 MG TABS Take 600 mg by mouth daily.     Turmeric 500 MG CAPS Take 500 mg by mouth daily.     No facility-administered medications prior to visit.    Allergies  Allergen Reactions   Tape Rash   HRC:BULAGTXM/[id number] except as noted in HPI      Objective:     BP (!) 150/80    Pulse 75    Temp 98.2 F (36.8 C) (Temporal)    Ht 5\' 5"  (1.651 m)    Wt 186 lb 6 oz (84.5 kg)    SpO2 98%    BMI 31.01 kg/m  Wt Readings from Last 3 Encounters:  02/05/22 186 lb 6 oz (84.5 kg)  07/22/21 175 lb (79.4 kg)  05/20/21 178  lb (80.7 kg)        Gen: WDWN NAD.  WF mod ill. congested HEENT: NCAT, conjunctiva not injected, sclera nonicteric TM WNL B, OP moist, no exudates  sinuses all tender NECK:  supple, no thyromegaly,+ submandibular nodes, no carotid bruits CARDIAC: RRR, S1S2+, no murmur. DP 2+B LUNGS: CTAB. No wheezes EXT:  no edema MSK: no gross abnormalities.  NEURO: A&O x3.  CN II-XII intact.  PSYCH: normal mood. Good eye contact  Assessment & Plan:   Problem List Items Addressed This Visit   None Visit Diagnoses     Acute pansinusitis, recurrence not specified    -  Primary   Cough, unspecified type       Relevant Orders   POC COVID-19 (Completed)   Chest congestion       Relevant Orders   POC COVID-19 (Completed)   Weakness       Relevant Orders   POC COVID-19 (Completed)      Sinusitis-augmentin. Prednisone.  Off work till 2/13.    No orders of the defined types were placed in this encounter.   Wellington Hampshire, MD

## 2022-02-13 ENCOUNTER — Other Ambulatory Visit: Payer: Self-pay | Admitting: Family Medicine

## 2022-02-13 ENCOUNTER — Encounter: Payer: Self-pay | Admitting: Family Medicine

## 2022-02-13 DIAGNOSIS — B3731 Acute candidiasis of vulva and vagina: Secondary | ICD-10-CM

## 2022-02-13 MED ORDER — FLUCONAZOLE 150 MG PO TABS: 150.0000 mg | ORAL_TABLET | Freq: Once | ORAL | 0 refills | Status: AC

## 2022-02-13 NOTE — Telephone Encounter (Signed)
Patient is calling re mychart message. Patient is aware we are awaiting doctors response.

## 2022-03-01 ENCOUNTER — Other Ambulatory Visit: Payer: Self-pay | Admitting: Family Medicine

## 2022-03-17 ENCOUNTER — Other Ambulatory Visit: Payer: Self-pay | Admitting: Family Medicine

## 2022-04-01 ENCOUNTER — Other Ambulatory Visit: Payer: Self-pay | Admitting: Family Medicine

## 2022-04-01 NOTE — Telephone Encounter (Signed)
Last visit: 02/05/22 ? ?Next visit: none scheduled ? ?Last filled: 09/30/21 ? ?Quantity: 90 w/ 5 refills  ?

## 2022-07-31 ENCOUNTER — Other Ambulatory Visit: Payer: Self-pay | Admitting: Obstetrics and Gynecology

## 2022-07-31 DIAGNOSIS — Z1231 Encounter for screening mammogram for malignant neoplasm of breast: Secondary | ICD-10-CM

## 2022-08-17 ENCOUNTER — Other Ambulatory Visit: Payer: Self-pay | Admitting: Obstetrics and Gynecology

## 2022-08-17 DIAGNOSIS — Z79890 Hormone replacement therapy (postmenopausal): Secondary | ICD-10-CM

## 2022-08-18 NOTE — Telephone Encounter (Signed)
Med refill request: Estrace 1 mg tab and Provera 2.5 mg tab   Last AEX:07/22/21/ JJ Next AEX: 09/11/22/ JJ Last MMG (if hormonal med) Screening 05/20/21, return for left BR Dx for left breast asymmetry. 06/13/21, Left BR Dx, Birads 1 neg.  Next MMG scheduled 09/04/22  30 day supply pended, no refills  Refill authorized: Please Advise on refills?

## 2022-08-24 ENCOUNTER — Encounter: Payer: Self-pay | Admitting: Family Medicine

## 2022-08-25 NOTE — Telephone Encounter (Signed)
Please see note.

## 2022-09-02 NOTE — Telephone Encounter (Signed)
Sent message to coding

## 2022-09-04 ENCOUNTER — Encounter: Payer: Self-pay | Admitting: Radiology

## 2022-09-04 ENCOUNTER — Ambulatory Visit
Admission: RE | Admit: 2022-09-04 | Discharge: 2022-09-04 | Disposition: A | Discharge status: Home / Self Care | Payer: BC Managed Care – PPO | Source: Ambulatory Visit | Attending: Obstetrics and Gynecology | Admitting: Obstetrics and Gynecology

## 2022-09-04 DIAGNOSIS — Z1231 Encounter for screening mammogram for malignant neoplasm of breast: Secondary | ICD-10-CM

## 2022-09-05 NOTE — Telephone Encounter (Signed)
Claim has been resubmitted.  Patient has been informed.

## 2022-09-08 NOTE — Progress Notes (Signed)
61 y.o. [id number] Married White or Caucasian Not Hispanic or Latino female here for annual exam. No vaginal bleeding. She is wanting to discuss going off HRT, currently on 1 mg or oral estrogen and 2.5 mg of provera daily. No vasomotor symptoms.  Sexually active, no pain.    She has a small white bump on the left vulva.   Struggles with constipation. Improved some with prune juice.     Patient's last menstrual period was 03/29/2018.          Sexually active: Yes.    The current method of family planning is post menopausal status.    Exercising: Yes.     Walking  Smoker:  no  Health Maintenance: Pap:07/22/21 WNL Hr HPV Neg,  07-21-16 WNL History of abnormal Pap:  no MMG:  09/05/22 Density B Bi-rads 1 neg  BMD:   02-11-12 WNL  Colonoscopy:  01-10-14 WNL, f/u 10 years TDaP:  04/26/15 Gardasil: NA   reports that she quit smoking about 10 years ago. Her smoking use included cigarettes. She has a 7.50 pack-year smoking history. She has never used smokeless tobacco. She reports that she does not currently use alcohol. She reports that she does not use drugs. No ETOH. 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 Husband is a Production manager and writes [employer], just retired but working part time.  2 grown stepsons. Granddaughters are 6 and 8 both with dicer one syndrome, both have had to have partial lung resections. It is a rare type of cancer. They are both doing well.  Past Medical History:  Diagnosis Date   Abnormal uterine bleeding    Allergic rhinitis    Anxiety    Chronic constipation    03-29-2018 per pt hx fecal retention   Depression    GERD (gastroesophageal reflux disease)    Hiatal hernia    History of basal cell carcinoma (BCC) excision    2016  nasal area   Hyperlipidemia    Hypertension    Infertility, female    OA (osteoarthritis)    left shoulder(spur), left hip(spur), spine   PVC's (premature ventricular contractions)    03-29-2018  per pt since age 20s,  hx holter monitor showed pvc's, per pt has never caused sob or chest pain   Restless leg syndrome    Seasonal allergies    Wears glasses     Past Surgical History:  Procedure Laterality Date   CHOLECYSTECTOMY  01/01/2011   DILATATION & CURETTAGE/HYSTEROSCOPY WITH MYOSURE N/A 04/05/2018   Procedure: DILATATION & CURETTAGE/HYSTEROSCOPY WITH MYOSURE;  Surgeon: Salvadore Dom, MD;  Location: [facility];  Service: Gynecology;  Laterality: N/A;   DILATION AND CURETTAGE OF UTERUS  08/1996   w/ suction for missed ab   ENDOMETRIAL ABLATION  2002   "per pt cryo endometrial ablation"   LAPAROSCOPIC CHOLECYSTECTOMY  12/2010   LUMBAR LAMINECTOMY  03/2000;   RE-DO  12/ 2001   L4-5   LUMBAR LAMINECTOMY  2001   MOHS SURGERY  2016   left side of nose    TUBAL LIGATION  09/1999   TUBAL LIGATION  01/1999    Current Outpatient Medications  Medication Sig Dispense Refill   Ascorbic Acid (VITAMIN C) 500 MG CAPS      aspirin EC 81 MG tablet Take 81 mg by mouth daily.     Calcium Citrate-Vitamin D (CALCIUM CITRATE + D3 PO) Take by mouth.     clonazePAM (KLONOPIN) 1 MG tablet  TAKE 1 TABLET BY MOUTH THREE TIMES A DAY AS NEEDED FOR ANXIETY 90 tablet 5   escitalopram (LEXAPRO) 10 MG tablet TAKE 1 TABLET BY MOUTH EVERY DAY 90 tablet 2   estradiol (ESTRACE) 1 MG tablet TAKE 1 TABLET BY MOUTH EVERY DAY 30 tablet 0   Garlic 1062 MG CAPS Take 1,000 mg by mouth daily.     KRILL OIL PO Take 1 capsule by mouth daily. 300 MG     lisinopril (ZESTRIL) 10 MG tablet TAKE 1 TABLET BY MOUTH EVERY DAY IN THE EVENING 90 tablet 1   Loratadine 10 MG CAPS Take by mouth.     medroxyPROGESTERone (PROVERA) 2.5 MG tablet TAKE 1 TABLET BY MOUTH EVERY DAY 30 tablet 0   melatonin 5 MG TABS      Misc Natural Products (GLUCOSAMINE CHOND COMPLEX/MSM) TABS      Multiple Vitamin (MULTIVITAMIN WITH MINERALS) TABS tablet Take 1 tablet by mouth daily.     Red Yeast Rice 600 MG TABS Take 600 mg by mouth daily.      Turmeric 500 MG CAPS Take 500 mg by mouth daily.     No current facility-administered medications for this visit.    Family History  Problem Relation Age of Onset   Rheumatic fever Mother 30       Rheumatic heart disease - she died in childbirth;   Early death Mother    Hearing loss Mother    Heart disease Mother    Heart attack Mother    Hypertension Father    COPD Father        Also paternal uncle   Alcohol abuse Father    Kidney disease Father    Polycythemia Sister    Depression Sister    Coronary artery disease Paternal Grandmother    Heart attack Paternal Grandmother    Heart disease Paternal Grandmother    Depression Sister    Lung cancer Paternal Uncle        As well as 3 maternal aunts   Alcohol abuse Maternal Grandfather    COPD Maternal Grandfather    Diabetes Maternal Grandfather    Stroke Maternal Grandfather    Alcohol abuse Paternal Grandfather    Mental illness Paternal Grandfather    Lung cancer Maternal Aunt    Brain cancer Maternal Aunt     Review of Systems  All other systems reviewed and are negative.   Exam:   BP 134/76   Pulse 77   Ht '5\' 5"'$  (1.651 m)   Wt 198 lb (89.8 kg)   LMP 03/29/2018   SpO2 100%   BMI 32.95 kg/m   Weight change: '@WEIGHTCHANGE'$ @ Height:   Height: '5\' 5"'$  (165.1 cm)  Ht Readings from Last 3 Encounters:  09/11/22 '5\' 5"'$  (1.651 m)  09/11/22 '5\' 5"'$  (1.651 m)  02/05/22 '5\' 5"'$  (1.651 m)    General appearance: alert, cooperative and appears stated age Head: Normocephalic, without obvious abnormality, atraumatic Neck: no adenopathy, supple, symmetrical, trachea midline and thyroid normal to inspection and palpation Lungs: clear to auscultation bilaterally Cardiovascular: regular rate and rhythm Breasts: normal appearance, no masses or tenderness Abdomen: soft, non-tender; non distended,  no masses,  no organomegaly Extremities: extremities normal, atraumatic, no cyanosis or edema Skin: Skin color, texture, turgor normal. No  rashes or lesions Lymph nodes: Cervical, supraclavicular, and axillary nodes normal. No abnormal inguinal nodes palpated Neurologic: Grossly normal   Pelvic: External genitalia:  no lesions  Urethra:  normal appearing urethra with no masses, tenderness or lesions              Bartholins and Skenes: normal                 Vagina: normal appearing vagina with normal color and discharge, no lesions              Cervix: no lesions               Bimanual Exam:  Uterus:   no masses or tenderness              Adnexa: no mass, fullness, tenderness               Rectovaginal: Confirms               Anus:  normal sphincter tone, no lesions  Gae Dry chaperoned for the exam.  1. Well woman exam Discussed breast self exam Discussed calcium and vit D intake Mammogram, pap, colonoscopy and labs are UTD  2. Hormone replacement therapy (HRT) Will decrease her estrogen dose, she may try to wean off of HRT. - estradiol (ESTRACE) 0.5 MG tablet; Take 1 tablet (0.5 mg total) by mouth daily.  Dispense: 90 tablet; Refill: 3 - medroxyPROGESTERone (PROVERA) 2.5 MG tablet; Take 1 tablet (2.5 mg total) by mouth daily.  Dispense: 90 tablet; Refill: 3

## 2022-09-10 ENCOUNTER — Other Ambulatory Visit: Payer: Self-pay | Admitting: Obstetrics and Gynecology

## 2022-09-10 DIAGNOSIS — Z79890 Hormone replacement therapy (postmenopausal): Secondary | ICD-10-CM

## 2022-09-10 NOTE — Telephone Encounter (Signed)
Last annual exam 06/2021 Scheduled on 09/11/22  Mammogram 09/04/22  Pharmacy attached note to Rx request "Pharmacy comment: REQUEST FOR 90 DAYS PRESCRIPTION. DX Code Needed."

## 2022-09-10 NOTE — Telephone Encounter (Signed)
Will discuss refills at her annual exam tomorrow.

## 2022-09-10 NOTE — Telephone Encounter (Signed)
Last annual exam 06/2021 Scheduled on 09/11/22  Mammogram 09/04/22

## 2022-09-11 ENCOUNTER — Encounter: Payer: Self-pay | Admitting: Obstetrics and Gynecology

## 2022-09-11 ENCOUNTER — Ambulatory Visit (INDEPENDENT_AMBULATORY_CARE_PROVIDER_SITE_OTHER): Payer: BC Managed Care – PPO | Admitting: Family Medicine

## 2022-09-11 ENCOUNTER — Ambulatory Visit (INDEPENDENT_AMBULATORY_CARE_PROVIDER_SITE_OTHER): Payer: BC Managed Care – PPO | Admitting: Obstetrics and Gynecology

## 2022-09-11 ENCOUNTER — Ambulatory Visit: Payer: BC Managed Care – PPO | Admitting: Obstetrics and Gynecology

## 2022-09-11 VITALS — BP 124/71 | HR 69 | Temp 97.8°F | Ht 65.0 in | Wt 198.2 lb

## 2022-09-11 VITALS — BP 134/76 | HR 77 | Ht 65.0 in | Wt 198.0 lb

## 2022-09-11 DIAGNOSIS — Z01419 Encounter for gynecological examination (general) (routine) without abnormal findings: Secondary | ICD-10-CM | POA: Diagnosis not present

## 2022-09-11 DIAGNOSIS — F419 Anxiety disorder, unspecified: Secondary | ICD-10-CM | POA: Diagnosis not present

## 2022-09-11 DIAGNOSIS — E785 Hyperlipidemia, unspecified: Secondary | ICD-10-CM

## 2022-09-11 DIAGNOSIS — Z780 Asymptomatic menopausal state: Secondary | ICD-10-CM

## 2022-09-11 DIAGNOSIS — I10 Essential (primary) hypertension: Secondary | ICD-10-CM | POA: Diagnosis not present

## 2022-09-11 DIAGNOSIS — Z79890 Hormone replacement therapy (postmenopausal): Secondary | ICD-10-CM

## 2022-09-11 DIAGNOSIS — K219 Gastro-esophageal reflux disease without esophagitis: Secondary | ICD-10-CM

## 2022-09-11 DIAGNOSIS — M109 Gout, unspecified: Secondary | ICD-10-CM | POA: Diagnosis not present

## 2022-09-11 DIAGNOSIS — Z0001 Encounter for general adult medical examination with abnormal findings: Secondary | ICD-10-CM

## 2022-09-11 LAB — COMPREHENSIVE METABOLIC PANEL
ALT: 20 U/L (ref 0–35)
AST: 23 U/L (ref 0–37)
Albumin: 3.8 g/dL (ref 3.5–5.2)
Alkaline Phosphatase: 45 U/L (ref 39–117)
BUN: 24 mg/dL — ABNORMAL HIGH (ref 6–23)
CO2: 28 mEq/L (ref 19–32)
Calcium: 9.8 mg/dL (ref 8.4–10.5)
Chloride: 104 mEq/L (ref 96–112)
Creatinine, Ser: 0.87 mg/dL (ref 0.40–1.20)
GFR: 71.9 mL/min (ref >60.00)
Glucose, Bld: 91 mg/dL (ref 70–99)
Potassium: 4.3 mEq/L (ref 3.5–5.1)
Sodium: 139 mEq/L (ref 135–145)
Total Bilirubin: 0.3 mg/dL (ref 0.2–1.2)
Total Protein: 6.7 g/dL (ref 6.0–8.3)

## 2022-09-11 LAB — CBC
HCT: 40.1 % (ref 36.0–46.0)
Hemoglobin: 13.6 g/dL (ref 12.0–15.0)
MCHC: 33.8 g/dL (ref 30.0–36.0)
MCV: 88.3 fl (ref 78.0–100.0)
Platelets: 247 10*3/uL (ref 150.0–400.0)
RBC: 4.54 Mil/uL (ref 3.87–5.11)
RDW: 13.6 % (ref 11.5–15.5)
WBC: 5.4 10*3/uL (ref 4.0–10.5)

## 2022-09-11 LAB — TSH: TSH: 0.95 u[IU]/mL (ref 0.35–5.50)

## 2022-09-11 LAB — LIPID PANEL
Cholesterol: 176 mg/dL (ref 0–200)
HDL: 47.9 mg/dL (ref >39.00)
LDL Cholesterol: 108 mg/dL — ABNORMAL HIGH (ref 0–99)
NonHDL: 128.14
Total CHOL/HDL Ratio: 4
Triglycerides: 103 mg/dL (ref 0.0–149.0)
VLDL: 20.6 mg/dL (ref 0.0–40.0)

## 2022-09-11 LAB — URIC ACID: Uric Acid, Serum: 6 mg/dL (ref 2.4–7.0)

## 2022-09-11 LAB — HEMOGLOBIN A1C: Hgb A1c MFr Bld: 5.6 % (ref 4.6–6.5)

## 2022-09-11 MED ORDER — MEDROXYPROGESTERONE ACETATE 2.5 MG PO TABS: 2.5000 mg | ORAL_TABLET | Freq: Every day | ORAL | 3 refills | Status: DC

## 2022-09-11 MED ORDER — ESTRADIOL 0.5 MG PO TABS: 0.5000 mg | ORAL_TABLET | Freq: Every day | ORAL | 3 refills | Status: DC

## 2022-09-11 NOTE — Assessment & Plan Note (Signed)
At goal on lisinopril '10mg'$  daily. Check labs.

## 2022-09-11 NOTE — Assessment & Plan Note (Signed)
Follows with GYN. Currently on hormone replacement.

## 2022-09-11 NOTE — Patient Instructions (Signed)
It was very nice to see you today!  We will check blood work today.  Keep working on diet and exercise.  I will see you back in a year for your next physical or sooner if needed.   Take care, Dr Jerline Pain  PLEASE NOTE:  If you had any lab tests please let us know if you have not heard back within a few days. You may see your results on mychart before we have a chance to review them but we will give you a call once they are reviewed by [place]. If we ordered any referrals today, please let us know if you have not heard from their office within the next week.   Please try these tips to maintain a healthy lifestyle:  Eat at least 3 REAL meals and 1-2 snacks per day.  Aim for no more than 5 hours between eating.  If you eat breakfast, please do so within one hour of getting up.   Each meal should contain half fruits/vegetables, one quarter protein, and one quarter carbs (no bigger than a computer mouse)  Cut down on sweet beverages. This includes juice, soda, and sweet tea.   Drink at least 1 glass of water with each meal and aim for at least 8 glasses per day  Exercise at least 150 minutes every week.    Preventive Care 61-10 Years Old, Female Preventive care refers to lifestyle choices and visits with your health care provider that can promote health and wellness. Preventive care visits are also called wellness exams. What can I expect for my preventive care visit? Counseling Your health care provider may ask you questions about your: Medical history, including: Past medical problems. Family medical history. Pregnancy history. Current health, including: Menstrual cycle. Method of birth control. Emotional well-being. Home life and relationship well-being. Sexual activity and sexual health. Lifestyle, including: Alcohol, nicotine or tobacco, and drug use. Access to firearms. Diet, exercise, and sleep habits. Work and work Statistician. Sunscreen use. Safety issues such as seatbelt  and bike helmet use. Physical exam Your health care provider will check your: Height and weight. These may be used to calculate your BMI (body mass index). BMI is a measurement that tells if you are at a healthy weight. Waist circumference. This measures the distance around your waistline. This measurement also tells if you are at a healthy weight and may help predict your risk of certain diseases, such as type 2 diabetes and high blood pressure. Heart rate and blood pressure. Body temperature. Skin for abnormal spots. What immunizations do I need?  Vaccines are usually given at various ages, according to a schedule. Your health care provider will recommend vaccines for you based on your age, medical history, and lifestyle or other factors, such as travel or where you work. What tests do I need? Screening Your health care provider may recommend screening tests for certain conditions. This may include: Lipid and cholesterol levels. Diabetes screening. This is done by checking your blood sugar (glucose) after you have not eaten for a while (fasting). Pelvic exam and Pap test. Hepatitis B test. Hepatitis C test. HIV (human immunodeficiency virus) test. STI (sexually transmitted infection) testing, if you are at risk. Lung cancer screening. Colorectal cancer screening. Mammogram. Talk with your health care provider about when you should start having regular mammograms. This may depend on whether you have a family history of breast cancer. BRCA-related cancer screening. This may be done if you have a family history of breast, ovarian, tubal,  or peritoneal cancers. Bone density scan. This is done to screen for osteoporosis. Talk with your health care provider about your test results, treatment options, and if necessary, the need for more tests. Follow these instructions at home: Eating and drinking  Eat a diet that includes fresh fruits and vegetables, whole grains, lean protein, and low-fat  dairy products. Take vitamin and mineral supplements as recommended by your health care provider. Do not drink alcohol if: Your health care provider tells you not to drink. You are pregnant, may be pregnant, or are planning to become pregnant. If you drink alcohol: Limit how much you have to 0-1 drink a day. Know how much alcohol is in your drink. In the [place] one drink equals one 12 oz bottle of beer (355 mL), one 5 oz glass of wine (148 mL), or one 1 oz glass of hard liquor (44 mL). Lifestyle Brush your teeth every morning and night with fluoride toothpaste. Floss one time each day. Exercise for at least 30 minutes 5 or more days each week. Do not use any products that contain nicotine or tobacco. These products include cigarettes, chewing tobacco, and vaping devices, such as e-cigarettes. If you need help quitting, ask your health care provider. Do not use drugs. If you are sexually active, practice safe sex. Use a condom or other form of protection to prevent STIs. If you do not wish to become pregnant, use a form of birth control. If you plan to become pregnant, see your health care provider for a prepregnancy visit. Take aspirin only as told by your health care provider. Make sure that you understand how much to take and what form to take. Work with your health care provider to find out whether it is safe and beneficial for you to take aspirin daily. Find healthy ways to manage stress, such as: Meditation, yoga, or listening to music. Journaling. Talking to a trusted person. Spending time with friends and family. Minimize exposure to UV radiation to reduce your risk of skin cancer. Safety Always wear your seat belt while driving or riding in a vehicle. Do not drive: If you have been drinking alcohol. Do not ride with someone who has been drinking. When you are tired or distracted. While texting. If you have been using any mind-altering substances or drugs. Wear a helmet and other  protective equipment during sports activities. If you have firearms in your house, make sure you follow all gun safety procedures. Seek help if you have been physically or sexually abused. What's next? Visit your health care provider once a year for an annual wellness visit. Ask your health care provider how often you should have your eyes and teeth checked. Stay up to date on all vaccines. This information is not intended to replace advice given to you by your health care provider. Make sure you discuss any questions you have with your health care provider. Document Revised: 06/12/2021 Document Reviewed: 06/12/2021 Elsevier Patient Education  [place].

## 2022-09-11 NOTE — Progress Notes (Signed)
Chief Complaint:  Alice Hodges is a 61 y.o. female who presents today for her annual comprehensive physical exam.    Assessment/Plan:  New/Acute Problems: Rash No red flags.  No current rash.  May be heat dermatitis.  She can use antihistamines as needed.  We discussed reasons to return to care.  Chronic Problems Addressed Today: Menopause Follows with GYN. Currently on hormone replacement.   Gout No recent flares. Check uric acid level today.   Essential hypertension At goal on lisinopril '10mg'$  daily. Check labs.   Dyslipidemia Check lipids. Discussed lifestyle modification.   Anxiety Symptoms are stable on clonazepam '1mg'$  three times daily as needed. Continue lexapro '10mg'$  daily.   Preventative Healthcare: Check labs.  We will follow-up with gynecology later today for routine women's health screening.  Due for colonoscopy in 2025.  Due for pneumonia vaccine age 63.  Patient Counseling(The following topics were reviewed and/or handout was given):  -Nutrition: Stressed importance of moderation in sodium/caffeine intake, saturated fat and cholesterol, caloric balance, sufficient intake of fresh fruits, vegetables, and fiber.  -Stressed the importance of regular exercise.   -Substance Abuse: Discussed cessation/primary prevention of tobacco, alcohol, or other drug use; driving or other dangerous activities under the influence; availability of treatment for abuse.   -Injury prevention: Discussed safety belts, safety helmets, smoke detector, smoking near bedding or upholstery.   -Sexuality: Discussed sexually transmitted diseases, partner selection, use of condoms, avoidance of unintended pregnancy and contraceptive alternatives.   -Dental health: Discussed importance of regular tooth brushing, flossing, and dental visits.  -Health maintenance and immunizations reviewed. Please refer to Health maintenance section.  Return to care in 1 year for next preventative visit.      Subjective:  HPI:  She has no acute complaints today.   She has noticed over the last few months she has noticed she gets a red splotchy rash when exposed to heat on her extremities. No pain or itching. No burning.   Lifestyle Diet: Balanced. Trying to get plenty of fruit and vegetables.  Exercise: Does a lot of walking.      05/20/2021   11:47 AM  Depression screen PHQ 2/9  Decreased Interest 0  Down, Depressed, Hopeless 2  PHQ - 2 Score 2  Altered sleeping 1  Tired, decreased energy 1  Change in appetite 2  Feeling bad or failure about yourself  0  Trouble concentrating 0  Moving slowly or fidgety/restless 0  Suicidal thoughts 0  PHQ-9 Score 6  Difficult doing work/chores Somewhat difficult    There are no preventive care reminders to display for this patient.   ROS: Per HPI, otherwise a complete review of systems was negative.   PMH:  The following were reviewed and entered/updated in epic: Past Medical History:  Diagnosis Date   Abnormal uterine bleeding    Allergic rhinitis    Anxiety    Chronic constipation    03-29-2018 per pt hx fecal retention   Depression    GERD (gastroesophageal reflux disease)    Hiatal hernia    History of basal cell carcinoma (BCC) excision    2016  nasal area   Hyperlipidemia    Hypertension    Infertility, female    OA (osteoarthritis)    left shoulder(spur), left hip(spur), spine   PVC's (premature ventricular contractions)    03-29-2018  per pt since age 66s, hx holter monitor showed pvc's, per pt has never caused sob or chest pain   Restless leg syndrome  Seasonal allergies    Wears glasses    Patient Active Problem List   Diagnosis Date Noted   Constipation 05/20/2021   GERD (gastroesophageal reflux disease) 03/15/2019   Gout 03/15/2019   Menopause 04/05/2018   Anxiety 02/15/2018   Skin lesions 12/03/2017   Restless leg syndrome 11/06/2017   Chest pain with moderate risk for cardiac etiology 04/30/2017    Abnormal finding on EKG 04/30/2017   Dyslipidemia 04/30/2017   Essential hypertension 04/30/2017   AC joint arthropathy 10/29/2016   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   Allergic rhinitis 11/24/2011   Degenerative disc disease, lumbar 07/31/2009   Past Surgical History:  Procedure Laterality Date   CHOLECYSTECTOMY  01/01/2011   DILATATION & CURETTAGE/HYSTEROSCOPY WITH MYOSURE N/A 04/05/2018   Procedure: DILATATION & CURETTAGE/HYSTEROSCOPY WITH MYOSURE;  Surgeon: Salvadore Dom, MD;  Location: [facility];  Service: Gynecology;  Laterality: N/A;   DILATION AND CURETTAGE OF UTERUS  08/1996   w/ suction for missed ab   ENDOMETRIAL ABLATION  2002   "per pt cryo endometrial ablation"   LAPAROSCOPIC CHOLECYSTECTOMY  12/2010   LUMBAR LAMINECTOMY  03/2000;   RE-DO  12/ 2001   L4-5   LUMBAR LAMINECTOMY  2001   MOHS SURGERY  2016   left side of nose    TUBAL LIGATION  09/1999   TUBAL LIGATION  01/1999    Family History  Problem Relation Age of Onset   Rheumatic fever Mother 61       Rheumatic heart disease - she died in childbirth;   Early death Mother    Hearing loss Mother    Heart disease Mother    Heart attack Mother    Hypertension Father    COPD Father        Also paternal uncle   Alcohol abuse Father    Kidney disease Father    Polycythemia Sister    Depression Sister    Coronary artery disease Paternal Grandmother    Heart attack Paternal Grandmother    Heart disease Paternal Grandmother    Depression Sister    Lung cancer Paternal Uncle        As well as 3 maternal aunts   Alcohol abuse Maternal Grandfather    COPD Maternal Grandfather    Diabetes Maternal Grandfather    Stroke Maternal Grandfather    Alcohol abuse Paternal Grandfather    Mental illness Paternal Grandfather    Lung cancer Maternal Aunt    Brain cancer Maternal Aunt     Medications- reviewed and updated Current Outpatient Medications  Medication Sig Dispense Refill    Ascorbic Acid (VITAMIN C) 500 MG CAPS      aspirin EC 81 MG tablet Take 81 mg by mouth daily.     Calcium Citrate-Vitamin D (CALCIUM CITRATE + D3 PO) Take by mouth.     clonazePAM (KLONOPIN) 1 MG tablet TAKE 1 TABLET BY MOUTH THREE TIMES A DAY AS NEEDED FOR ANXIETY 90 tablet 5   escitalopram (LEXAPRO) 10 MG tablet TAKE 1 TABLET BY MOUTH EVERY DAY 90 tablet 2   estradiol (ESTRACE) 1 MG tablet TAKE 1 TABLET BY MOUTH EVERY DAY 30 tablet 0   Garlic 3419 MG CAPS Take 1,000 mg by mouth daily.     KRILL OIL PO Take 1 capsule by mouth daily. 300 MG     lisinopril (ZESTRIL) 10 MG tablet TAKE 1 TABLET BY MOUTH EVERY DAY IN THE EVENING 90 tablet 1   Loratadine 10 MG CAPS  Take by mouth.     medroxyPROGESTERone (PROVERA) 2.5 MG tablet TAKE 1 TABLET BY MOUTH EVERY DAY 30 tablet 0   melatonin 5 MG TABS      Misc Natural Products (GLUCOSAMINE CHOND COMPLEX/MSM) TABS      Multiple Vitamin (MULTIVITAMIN WITH MINERALS) TABS tablet Take 1 tablet by mouth daily.     Red Yeast Rice 600 MG TABS Take 600 mg by mouth daily.     Turmeric 500 MG CAPS Take 500 mg by mouth daily.     No current facility-administered medications for this visit.    Allergies-reviewed and updated Allergies  Allergen Reactions   Tape Rash    Social History   Socioeconomic History   Marital status: Married    Spouse name: Not on file   Number of children: Not on file   Years of education: Not on file   Highest education level: Bachelor's degree (e.g., BA, AB, BS)  Occupational History   Occupation: Self Employed  Tobacco Use   Smoking status: Former    Packs/day: 0.50    Years: 15.00    Total pack years: 7.50    Types: Cigarettes    Quit date: 04/28/2012    Years since quitting: 10.3   Smokeless tobacco: Never  Vaping Use   Vaping Use: Never used  Substance and Sexual Activity   Alcohol use: Not Currently   Drug use: No   Sexual activity: Yes    Partners: Male    Birth control/protection: Surgical    Comment: Tubal  [place]   Other Topics Concern   Not on file  Social History Narrative   She and her husband Alice Hodges) recently moved from [facility] down to [place] for better job opportunities. 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 She is now ready to set herself back up working.      - second marriage. No children. Her current husband Alice Hodges) has children and grandchildren.Granddaughter just diagnosed with cancer   She is starting to try to go out and exercise with her husband walking on it routinely.   Social Determinants of Health   Financial Resource Strain: Not on file  Food Insecurity: Not on file  Transportation Needs: Not on file  Physical Activity: Not on file  Stress: Not on file  Social Connections: Not on file        Objective:  Physical Exam: BP 124/71   Pulse 69   Temp 97.8 F (36.6 C) (Temporal)   Ht '5\' 5"'$  (1.651 m)   Wt 198 lb 3.2 oz (89.9 kg)   LMP 03/29/2018   SpO2 97%   BMI 32.98 kg/m   Body mass index is 32.98 kg/m. Wt Readings from Last 3 Encounters:  09/11/22 198 lb 3.2 oz (89.9 kg)  02/05/22 186 lb 6 oz (84.5 kg)  07/22/21 175 lb (79.4 kg)  Gen: NAD, resting comfortably HEENT: TMs normal bilaterally. OP clear. No thyromegaly noted.  CV: RRR with no murmurs appreciated Pulm: NWOB, CTAB with no crackles, wheezes, or rhonchi GI: Normal bowel sounds present. Soft, Nontender, Nondistended. MSK: no edema, cyanosis, or clubbing noted Skin: warm, dry Neuro: CN2-12 grossly intact. Strength 5/5 in upper and lower extremities. Reflexes symmetric and intact bilaterally.  Psych: Normal affect and thought content     Alice Hodges M. Jerline Pain, MD 09/11/2022 8:49 AM

## 2022-09-11 NOTE — Patient Instructions (Signed)

## 2022-09-11 NOTE — Assessment & Plan Note (Signed)
Symptoms are stable on clonazepam '1mg'$  three times daily as needed. Continue lexapro '10mg'$  daily.

## 2022-09-11 NOTE — Assessment & Plan Note (Signed)
No recent flares.  Check uric acid level today. 

## 2022-09-11 NOTE — Assessment & Plan Note (Signed)
Check lipids. Discussed lifestyle modification.

## 2022-09-13 ENCOUNTER — Other Ambulatory Visit: Payer: Self-pay | Admitting: Family Medicine

## 2022-09-14 ENCOUNTER — Other Ambulatory Visit: Payer: Self-pay | Admitting: Obstetrics and Gynecology

## 2022-09-14 DIAGNOSIS — Z79890 Hormone replacement therapy (postmenopausal): Secondary | ICD-10-CM

## 2022-09-15 NOTE — Progress Notes (Signed)
Please inform patient of the following:  Labs are all stable.  Do not need to make any changes to treatment plan at this time.  She should continue to work on diet and exercise and we can recheck in a year.

## 2022-10-14 ENCOUNTER — Other Ambulatory Visit: Payer: Self-pay | Admitting: Family Medicine

## 2022-12-11 ENCOUNTER — Other Ambulatory Visit: Payer: Self-pay | Admitting: Family Medicine

## 2023-01-15 ENCOUNTER — Ambulatory Visit (INDEPENDENT_AMBULATORY_CARE_PROVIDER_SITE_OTHER)
Admission: RE | Admit: 2023-01-15 | Discharge: 2023-01-15 | Disposition: A | Discharge status: Home / Self Care | Payer: BC Managed Care – PPO | Source: Ambulatory Visit | Attending: Family Medicine | Admitting: Family Medicine

## 2023-01-15 ENCOUNTER — Ambulatory Visit (INDEPENDENT_AMBULATORY_CARE_PROVIDER_SITE_OTHER): Payer: BC Managed Care – PPO | Admitting: Family Medicine

## 2023-01-15 ENCOUNTER — Encounter: Payer: Self-pay | Admitting: Family Medicine

## 2023-01-15 VITALS — BP 146/82 | HR 57 | Temp 97.8°F | Ht 65.0 in | Wt 202.0 lb

## 2023-01-15 DIAGNOSIS — I10 Essential (primary) hypertension: Secondary | ICD-10-CM | POA: Diagnosis not present

## 2023-01-15 DIAGNOSIS — M109 Gout, unspecified: Secondary | ICD-10-CM

## 2023-01-15 DIAGNOSIS — M79642 Pain in left hand: Secondary | ICD-10-CM

## 2023-01-15 DIAGNOSIS — M79641 Pain in right hand: Secondary | ICD-10-CM

## 2023-01-15 NOTE — Assessment & Plan Note (Signed)
Mildly elevated today though typically well-controlled.  She left the office before we can recheck.  Will continue lisinopril 10 mg daily.  She will continue to monitor at home and she can let us know if persistently elevated.

## 2023-01-15 NOTE — Assessment & Plan Note (Signed)
No recent flares.  Hand pain not consistent with gout.  We can check uric acid level next blood draw.

## 2023-01-15 NOTE — Patient Instructions (Addendum)
It was very nice to see you today!  I think you have arthritis in your hands. We will check x-rays today to confirm.  You can continue using the over-the-counter medications as needed.  Please try using Voltaren gel.  Let me know if your pain is not improving over the next couple of weeks with this.  You have urticaria on your legs.  This may be exacerbated by cold weather or exertion.  You can try using antihistamines as needed.  Let me know if this does not improve.  Take care, Dr Jerline Pain  PLEASE NOTE:  If you had any lab tests, please let us know if you have not heard back within a few days. You may see your results on mychart before we have a chance to review them but we will give you a call once they are reviewed by [place].   If we ordered any referrals today, please let us know if you have not heard from their office within the next week.   If you had any urgent prescriptions sent in today, please check with the pharmacy within an hour of our visit to make sure the prescription was transmitted appropriately.   Please try these tips to maintain a healthy lifestyle:  Eat at least 3 REAL meals and 1-2 snacks per day.  Aim for no more than 5 hours between eating.  If you eat breakfast, please do so within one hour of getting up.   Each meal should contain half fruits/vegetables, one quarter protein, and one quarter carbs (no bigger than a computer mouse)  Cut down on sweet beverages. This includes juice, soda, and sweet tea.   Drink at least 1 glass of water with each meal and aim for at least 8 glasses per day  Exercise at least 150 minutes every week.

## 2023-01-15 NOTE — Progress Notes (Signed)
Alice Hodges is a 62 y.o. female who presents today for an office visit.  Assessment/Plan:  New/Acute Problems: Bilateral Hand Pain Consistent with osteoarthritis.  Will check plain films today to confirm.  She does have a known history of gout however history is not consistent with this.  History not consistent with rheumatoid arthritis either.  She can continue using Tylenol and ibuprofen.  Also recommended Voltaren gel.  If x-ray confirms degenerative arthritis and symptoms persist would consider referral to sports medicine or physical therapy.  We discussed reasons to return to care  Rash Consistent with urticaria based on her pictures.  Possibly cold weather or exercise-induced.  She will continue using her Claritin but will add on Benadryl before exercise.  She has not experienced drowsiness with this in the past.  Chronic Problems Addressed Today: Gout No recent flares.  Hand pain not consistent with gout.  We can check uric acid level next blood draw.  Essential hypertension Mildly elevated today though typically well-controlled.  She left the office before we can recheck.  Will continue lisinopril 10 mg daily.  She will continue to monitor at home and she can let us know if persistently elevated.     Subjective:  HPI:  See A/P for status of chronic conditions.  Patient is here for pain and stiffness in bilateral hands and fingers.  This started suddenly a couple of weeks ago. She has noticed more swelling as well. Has had more difficulty with certain activities such as grasping and opening jars. Hard a time holding a pen and writing.  She has noticed she has had difficulty with tying her shoestrings, closing doors.  She has been using her phone more for pump action such as soap and air treatments.  Pain is worse in the morning but gets better as the day goes on.  Sometimes wakes her up at night with pain.  She has tried ibuprofen, Tylenol, and Aleve.  Aleve seems to work the  best.  Pain seems to be worse between the joints and the base of her fingers.  She has not had much pain in her thumb.  She has been playing tennis for the last 6 weeks or so and is not sure if this is contributing.  She does note that symptoms are better after playing tennis.  She has had some difficulty with wiping after bowel movement due to stiffness.  She has also had difficulty with holding a cup of coffee and brushing teeth.  She has also had intermittent rash on lower extremity.  Usually occurs after exertion.  Occasionally itchy.       Objective:  Physical Exam: BP (!) 146/82   Pulse (!) 57   Temp 97.8 F (36.6 C) (Temporal)   Ht '5\' 5"'$  (1.651 m)   Wt 202 lb (91.6 kg)   LMP 03/29/2018   SpO2 95%   BMI 33.61 kg/m   Gen: No acute distress, resting comfortably CV: Regular rate and rhythm with no murmurs appreciated Pulm: Normal work of breathing, clear to auscultation bilaterally with no crackles, wheezes, or rhonchi MSK:  - right hand: Mild effusion noted throughout.  Pain at third MCP joint.  Worsened with axial loading.  Neurovascular intact distally.  Mild joint hypertrophy noted at DIP joints. - Left hand: Mild joint hypertrophy noted.  Neurovascular intact distally.  No pain with axial loading Skin: Few hyperpigmented areas on lower extremities. Neuro: Grossly normal, moves all extremities Psych: Normal affect and thought content  Alice Hodges. Jerline Pain, MD 01/15/2023 10:50 AM

## 2023-01-16 NOTE — Progress Notes (Signed)
Please inform patient of the following:  X-rays confirm degenerative arthritis as we discussed at her office visit.  No signs of inflammatory or erosive arthropathy.  Recommend referral to sports medicine for further evaluation and management.

## 2023-01-22 NOTE — Progress Notes (Signed)
Agree with plan. We can refer to sports medicine if she is still having issues.  Algis Greenhouse. Jerline Pain, MD 01/22/2023 7:35 AM

## 2023-03-07 ENCOUNTER — Other Ambulatory Visit: Payer: Self-pay | Admitting: Family Medicine

## 2023-04-29 ENCOUNTER — Encounter: Payer: Self-pay | Admitting: Obstetrics and Gynecology

## 2023-05-03 ENCOUNTER — Other Ambulatory Visit: Payer: Self-pay | Admitting: Family Medicine

## 2023-05-18 ENCOUNTER — Other Ambulatory Visit: Payer: Self-pay | Admitting: Family Medicine

## 2023-05-18 DIAGNOSIS — Z1231 Encounter for screening mammogram for malignant neoplasm of breast: Secondary | ICD-10-CM

## 2023-08-13 ENCOUNTER — Encounter (INDEPENDENT_AMBULATORY_CARE_PROVIDER_SITE_OTHER): Payer: Self-pay

## 2023-08-20 ENCOUNTER — Other Ambulatory Visit: Payer: Self-pay

## 2023-08-20 DIAGNOSIS — Z79890 Hormone replacement therapy (postmenopausal): Secondary | ICD-10-CM

## 2023-08-20 MED ORDER — MEDROXYPROGESTERONE ACETATE 2.5 MG PO TABS: 2.5000 mg | ORAL_TABLET | Freq: Every day | ORAL | 1 refills | Status: DC

## 2023-08-20 MED ORDER — ESTRADIOL 0.5 MG PO TABS: 0.5000 mg | ORAL_TABLET | Freq: Every day | ORAL | 1 refills | Status: DC

## 2023-08-20 NOTE — Telephone Encounter (Signed)
Patient called stating she needed a refill of 2 medications.  Medication refill request: provera 2.5mg  & estrace tablet 0.5 Last AEX:  09-11-22 Next AEX: 01-04-24 Last MMG (if hormonal medication request): 09-04-22 birads 1:neg Refill authorized: please approve if appropriate

## 2023-08-21 NOTE — Telephone Encounter (Signed)
Patient aware rx's were sent to the pharmacy

## 2023-09-03 ENCOUNTER — Other Ambulatory Visit: Payer: Self-pay | Admitting: Family Medicine

## 2023-09-05 ENCOUNTER — Other Ambulatory Visit: Payer: Self-pay | Admitting: Family Medicine

## 2023-09-14 ENCOUNTER — Ambulatory Visit: Payer: BC Managed Care – PPO | Admitting: Obstetrics and Gynecology

## 2023-09-14 ENCOUNTER — Ambulatory Visit
Admission: RE | Admit: 2023-09-14 | Discharge: 2023-09-14 | Disposition: A | Discharge status: Home / Self Care | Payer: BC Managed Care – PPO | Source: Ambulatory Visit

## 2023-09-14 ENCOUNTER — Ambulatory Visit (INDEPENDENT_AMBULATORY_CARE_PROVIDER_SITE_OTHER): Payer: BC Managed Care – PPO | Admitting: Family Medicine

## 2023-09-14 ENCOUNTER — Encounter: Payer: Self-pay | Admitting: Family Medicine

## 2023-09-14 VITALS — BP 112/58 | HR 60 | Temp 97.5°F | Ht 64.57 in | Wt 207.6 lb

## 2023-09-14 DIAGNOSIS — F419 Anxiety disorder, unspecified: Secondary | ICD-10-CM

## 2023-09-14 DIAGNOSIS — E669 Obesity, unspecified: Secondary | ICD-10-CM | POA: Diagnosis not present

## 2023-09-14 DIAGNOSIS — Z1231 Encounter for screening mammogram for malignant neoplasm of breast: Secondary | ICD-10-CM

## 2023-09-14 DIAGNOSIS — Z6835 Body mass index (BMI) 35.0-35.9, adult: Secondary | ICD-10-CM | POA: Diagnosis not present

## 2023-09-14 DIAGNOSIS — Z131 Encounter for screening for diabetes mellitus: Secondary | ICD-10-CM

## 2023-09-14 DIAGNOSIS — L509 Urticaria, unspecified: Secondary | ICD-10-CM

## 2023-09-14 DIAGNOSIS — Z0001 Encounter for general adult medical examination with abnormal findings: Secondary | ICD-10-CM

## 2023-09-14 DIAGNOSIS — I10 Essential (primary) hypertension: Secondary | ICD-10-CM

## 2023-09-14 DIAGNOSIS — F321 Major depressive disorder, single episode, moderate: Secondary | ICD-10-CM | POA: Insufficient documentation

## 2023-09-14 DIAGNOSIS — M1990 Unspecified osteoarthritis, unspecified site: Secondary | ICD-10-CM

## 2023-09-14 DIAGNOSIS — E785 Hyperlipidemia, unspecified: Secondary | ICD-10-CM | POA: Diagnosis not present

## 2023-09-14 LAB — CBC
HCT: 41.8 % (ref 36.0–46.0)
Hemoglobin: 13.9 g/dL (ref 12.0–15.0)
MCHC: 33.1 g/dL (ref 30.0–36.0)
MCV: 89 fl (ref 78.0–100.0)
Platelets: 280 10*3/uL (ref 150.0–400.0)
RBC: 4.7 Mil/uL (ref 3.87–5.11)
RDW: 13.5 % (ref 11.5–15.5)
WBC: 6.5 10*3/uL (ref 4.0–10.5)

## 2023-09-14 LAB — COMPREHENSIVE METABOLIC PANEL
ALT: 24 U/L (ref 0–35)
AST: 23 U/L (ref 0–37)
Albumin: 4 g/dL (ref 3.5–5.2)
Alkaline Phosphatase: 55 U/L (ref 39–117)
BUN: 19 mg/dL (ref 6–23)
CO2: 27 meq/L (ref 19–32)
Calcium: 10.1 mg/dL (ref 8.4–10.5)
Chloride: 104 meq/L (ref 96–112)
Creatinine, Ser: 0.88 mg/dL (ref 0.40–1.20)
GFR: 70.42 mL/min (ref >60.00)
Glucose, Bld: 89 mg/dL (ref 70–99)
Potassium: 4.1 meq/L (ref 3.5–5.1)
Sodium: 139 meq/L (ref 135–145)
Total Bilirubin: 0.4 mg/dL (ref 0.2–1.2)
Total Protein: 6.9 g/dL (ref 6.0–8.3)

## 2023-09-14 LAB — LIPID PANEL
Cholesterol: 197 mg/dL (ref 0–200)
HDL: 47.5 mg/dL (ref >39.00)
LDL Cholesterol: 107 mg/dL — ABNORMAL HIGH (ref 0–99)
NonHDL: 149.04
Total CHOL/HDL Ratio: 4
Triglycerides: 211 mg/dL — ABNORMAL HIGH (ref 0.0–149.0)
VLDL: 42.2 mg/dL — ABNORMAL HIGH (ref 0.0–40.0)

## 2023-09-14 LAB — TSH: TSH: 1.92 u[IU]/mL (ref 0.35–5.50)

## 2023-09-14 LAB — HEMOGLOBIN A1C: Hgb A1c MFr Bld: 5.7 % (ref 4.6–6.5)

## 2023-09-14 NOTE — Assessment & Plan Note (Signed)
See anxiety A/P.  On Lexapro 10 mg daily.  Deferred making medication changes today and declined therapy referral.  She will follow-up with me in a few weeks via MyChart or sooner if she changes her mind.

## 2023-09-14 NOTE — Patient Instructions (Addendum)
It was very nice to see you today!  We will check blood work today  Please increase your claritin to 10 mg twice daily.   Please let me know if you would like to change your Lexapro or be referred to a therapist.  Please let me know if you need a referral to see an orthopedist.   Return in about 1 year (around 09/13/2024) for Annual Physical.   Take care, Dr Jimmey Ralph  PLEASE NOTE:  If you had any lab tests, please let us know if you have not heard back within a few days. You may see your results on mychart before we have a chance to review them but we will give you a call once they are reviewed by [place].   If we ordered any referrals today, please let us know if you have not heard from their office within the next week.   If you had any urgent prescriptions sent in today, please check with the pharmacy within an hour of our visit to make sure the prescription was transmitted appropriately.   Please try these tips to maintain a healthy lifestyle:  Eat at least 3 REAL meals and 1-2 snacks per day.  Aim for no more than 5 hours between eating.  If you eat breakfast, please do so within one hour of getting up.   Each meal should contain half fruits/vegetables, one quarter protein, and one quarter carbs (no bigger than a computer mouse)  Cut down on sweet beverages. This includes juice, soda, and sweet tea.   Drink at least 1 glass of water with each meal and aim for at least 8 glasses per day  Exercise at least 150 minutes every week.    Preventive Care 62-62 Years Old, Female Preventive care refers to lifestyle choices and visits with your health care provider that can promote health and wellness. Preventive care visits are also called wellness exams. What can I expect for my preventive care visit? Counseling Your health care provider may ask you questions about your: Medical history, including: Past medical problems. Family medical history. Pregnancy history. Current health,  including: Menstrual cycle. Method of birth control. Emotional well-being. Home life and relationship well-being. Sexual activity and sexual health. Lifestyle, including: Alcohol, nicotine or tobacco, and drug use. Access to firearms. Diet, exercise, and sleep habits. Work and work Astronomer. Sunscreen use. Safety issues such as seatbelt and bike helmet use. Physical exam Your health care provider will check your: Height and weight. These may be used to calculate your BMI (body mass index). BMI is a measurement that tells if you are at a healthy weight. Waist circumference. This measures the distance around your waistline. This measurement also tells if you are at a healthy weight and may help predict your risk of certain diseases, such as type 2 diabetes and high blood pressure. Heart rate and blood pressure. Body temperature. Skin for abnormal spots. What immunizations do I need?  Vaccines are usually given at various ages, according to a schedule. Your health care provider will recommend vaccines for you based on your age, medical history, and lifestyle or other factors, such as travel or where you work. What tests do I need? Screening Your health care provider may recommend screening tests for certain conditions. This may include: Lipid and cholesterol levels. Diabetes screening. This is done by checking your blood sugar (glucose) after you have not eaten for a while (fasting). Pelvic exam and Pap test. Hepatitis B test. Hepatitis C test. HIV (human  immunodeficiency virus) test. STI (sexually transmitted infection) testing, if you are at risk. Lung cancer screening. Colorectal cancer screening. Mammogram. Talk with your health care provider about when you should start having regular mammograms. This may depend on whether you have a family history of breast cancer. BRCA-related cancer screening. This may be done if you have a family history of breast, ovarian, tubal, or  peritoneal cancers. Bone density scan. This is done to screen for osteoporosis. Talk with your health care provider about your test results, treatment options, and if necessary, the need for more tests. Follow these instructions at home: Eating and drinking  Eat a diet that includes fresh fruits and vegetables, whole grains, lean protein, and low-fat dairy products. Take vitamin and mineral supplements as recommended by your health care provider. Do not drink alcohol if: Your health care provider tells you not to drink. You are pregnant, may be pregnant, or are planning to become pregnant. If you drink alcohol: Limit how much you have to 0-1 drink a day. Know how much alcohol is in your drink. In the [place] one drink equals one 12 oz bottle of beer (355 mL), one 5 oz glass of wine (148 mL), or one 1 oz glass of hard liquor (44 mL). Lifestyle Brush your teeth every morning and night with fluoride toothpaste. Floss one time each day. Exercise for at least 30 minutes 5 or more days each week. Do not use any products that contain nicotine or tobacco. These products include cigarettes, chewing tobacco, and vaping devices, such as e-cigarettes. If you need help quitting, ask your health care provider. Do not use drugs. If you are sexually active, practice safe sex. Use a condom or other form of protection to prevent STIs. If you do not wish to become pregnant, use a form of birth control. If you plan to become pregnant, see your health care provider for a prepregnancy visit. Take aspirin only as told by your health care provider. Make sure that you understand how much to take and what form to take. Work with your health care provider to find out whether it is safe and beneficial for you to take aspirin daily. Find healthy ways to manage stress, such as: Meditation, yoga, or listening to music. Journaling. Talking to a trusted person. Spending time with friends and family. Minimize exposure to UV  radiation to reduce your risk of skin cancer. Safety Always wear your seat belt while driving or riding in a vehicle. Do not drive: If you have been drinking alcohol. Do not ride with someone who has been drinking. When you are tired or distracted. While texting. If you have been using any mind-altering substances or drugs. Wear a helmet and other protective equipment during sports activities. If you have firearms in your house, make sure you follow all gun safety procedures. Seek help if you have been physically or sexually abused. What's next? Visit your health care provider once a year for an annual wellness visit. Ask your health care provider how often you should have your eyes and teeth checked. Stay up to date on all vaccines. This information is not intended to replace advice given to you by your health care provider. Make sure you discuss any questions you have with your health care provider. Document Revised: 06/12/2021 Document Reviewed: 06/12/2021 Elsevier Patient Education  2024 [employer].

## 2023-09-14 NOTE — Assessment & Plan Note (Signed)
BMI today is 35 with comorbidities.  We discussed lifestyle interventions.  We also discussed GLP-1 agonist however she would like to hold off on this for now.  She will continue work on diet and exercise.

## 2023-09-14 NOTE — Assessment & Plan Note (Signed)
Check lipids 

## 2023-09-14 NOTE — Assessment & Plan Note (Signed)
Worsened since our last visit.  She is on Lexapro 10 mg daily and clonazepam 1 mg 3 times daily as needed.  No red flags.  No SI or HI.  We did discuss medication adjustments versus referral to therapy.  She does not wish to pursue either of these at this point as symptoms are overall currently manageable.  She will follow-up with [place] in a few weeks via MyChart.  We can increase Lexapro to 20 mg daily versus addition of BuSpar if needed.  Also can refer to therapy if needed.

## 2023-09-14 NOTE — Assessment & Plan Note (Signed)
Blood pressure at goal today.  Continue lisinopril 10 mg daily.  Check labs.

## 2023-09-14 NOTE — Progress Notes (Signed)
Chief Complaint:  Alice Hodges is a 61 y.o. female who presents today for her annual comprehensive physical exam.    Assessment/Plan:  Chronic Problems Addressed Today: Anxiety Worsened since our last visit.  She is on Lexapro 10 mg daily and clonazepam 1 mg 3 times daily as needed.  No red flags.  No SI or HI.  We did discuss medication adjustments versus referral to therapy.  She does not wish to pursue either of these at this point as symptoms are overall currently manageable.  She will follow-up with [place] in a few weeks via MyChart.  We can increase Lexapro to 20 mg daily versus addition of BuSpar if needed.  Also can refer to therapy if needed.  Depression, major, single episode, moderate (HCC) See anxiety A/P.  On Lexapro 10 mg daily.  Deferred making medication changes today and declined therapy referral.  She will follow-up with me in a few weeks via MyChart or sooner if she changes her mind.  Osteoarthritis And pain has still been bothersome but overall manageable.  She has been using Arnica cream and over-the-counter ibuprofen and Tylenol with some improvement.  We did discuss referral to see sports medicine however she declined.  She will let us know if she changes her mind.  Urticaria Symptoms are still bothersome.  We will increase her Claritin to 10 mg twice daily.  Continue Benadryl as needed as well.  Would consider trial of Singulair if does not improve with this.  May ultimately need referral to allergy if this continues to be an issue.  Obesity BMI today is 35 with comorbidities.  We discussed lifestyle interventions.  We also discussed GLP-1 agonist however she would like to hold off on this for now.  She will continue work on diet and exercise.  Dyslipidemia Check lipids.   Essential hypertension Blood pressure at goal today.  Continue lisinopril 10 mg daily.  Check labs.  Preventative Healthcare: Mammogram was this morning.  Due for colonoscopy next year.  Check  labs.  She will get flu shot in a few weeks.  Patient Counseling(The following topics were reviewed and/or handout was given):  -Nutrition: Stressed importance of moderation in sodium/caffeine intake, saturated fat and cholesterol, caloric balance, sufficient intake of fresh fruits, vegetables, and fiber.  -Stressed the importance of regular exercise.   -Substance Abuse: Discussed cessation/primary prevention of tobacco, alcohol, or other drug use; driving or other dangerous activities under the influence; availability of treatment for abuse.   -Injury prevention: Discussed safety belts, safety helmets, smoke detector, smoking near bedding or upholstery.   -Sexuality: Discussed sexually transmitted diseases, partner selection, use of condoms, avoidance of unintended pregnancy and contraceptive alternatives.   -Dental health: Discussed importance of regular tooth brushing, flossing, and dental visits.  -Health maintenance and immunizations reviewed. Please refer to Health maintenance section.  Return to care in 1 year for next preventative visit.     Subjective:  HPI:  See A/P for status of chronic conditions.  She has a few additional issues that she would like to discuss today.  She does have issues with intermittent "splotches" has tried taking Benadryl which has helped but not completely resolved symptoms.  This has been a long standing issue and we have seen her for this previously. Claritin does help modestly though does not fully resolve her symptoms.  She is taking a Benadryl in the morning and Claritin at night.  Symptoms are worse when exercising out in the heat.  Also feels  like her mood has worsened.  She is feeling more sad and unfocused.  She has been under a lot more stress at work. Her husband has had a few medical scares as well.  Overall feels like she has benching well and does have good support at home.  She is tolerating medications well.  Does not wish to make any medication  changes today.  No SI or HI.  Hand pain is worsening. Arnica cream helps. Diclofenac gel made it worse. Takes tylenol and ibuprofen which works modestly well.   Lifestyle Diet: Trying to cut down on sweets.  Exercise: Does a lot of walking.      09/14/2023   11:02 AM  Depression screen PHQ 2/9  Decreased Interest 1  Down, Depressed, Hopeless 1  PHQ - 2 Score 2  Altered sleeping 0  Tired, decreased energy 2  Change in appetite 2  Feeling bad or failure about yourself  0  Trouble concentrating 1  Moving slowly or fidgety/restless 0  Suicidal thoughts 0  PHQ-9 Score 7    Health Maintenance Due  Topic Date Due   Cervical Cancer Screening (HPV/Pap Cotest)  Never done   MAMMOGRAM  09/05/2023   Colonoscopy  01/13/2024     ROS: Per HPI, otherwise a complete review of systems was negative.   PMH:  The following were reviewed and entered/updated in epic: Past Medical History:  Diagnosis Date   Abnormal uterine bleeding    Allergic rhinitis    Anxiety    Chronic constipation    03-29-2018 per pt hx fecal retention   Depression    GERD (gastroesophageal reflux disease)    Hiatal hernia    History of basal cell carcinoma (BCC) excision    2016  nasal area   Hyperlipidemia    Hypertension    Infertility, female    OA (osteoarthritis)    left shoulder(spur), left hip(spur), spine   PVC's (premature ventricular contractions)    03-29-2018  per pt since age 47s, hx holter monitor showed pvc's, per pt has never caused sob or chest pain   Restless leg syndrome    Seasonal allergies    Wears glasses    Patient Active Problem List   Diagnosis Date Noted   Depression, major, single episode, moderate (HCC) 09/14/2023   Osteoarthritis 09/14/2023   Urticaria 09/14/2023   Obesity 09/14/2023   Heat sensitivity 05/29/2021   Constipation 05/20/2021   GERD (gastroesophageal reflux disease) 03/15/2019   Gout 03/15/2019   Menopause 04/05/2018   Anxiety 02/15/2018   Skin lesions  12/03/2017   Restless leg syndrome 11/06/2017   Chest pain with moderate risk for cardiac etiology 04/30/2017   Abnormal finding on EKG 04/30/2017   Dyslipidemia 04/30/2017   Essential hypertension 04/30/2017   AC joint arthropathy 10/29/2016   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   Allergic rhinitis 11/24/2011   Degenerative disc disease, lumbar 07/31/2009   Past Surgical History:  Procedure Laterality Date   CHOLECYSTECTOMY  01/01/2011   DILATATION & CURETTAGE/HYSTEROSCOPY WITH MYOSURE N/A 04/05/2018   Procedure: DILATATION & CURETTAGE/HYSTEROSCOPY WITH MYOSURE;  Surgeon: Romualdo Bolk, MD;  Location: [facility];  Service: Gynecology;  Laterality: N/A;   DILATION AND CURETTAGE OF UTERUS  08/1996   w/ suction for missed ab   ENDOMETRIAL ABLATION  2002   "per pt cryo endometrial ablation"   LAPAROSCOPIC CHOLECYSTECTOMY  12/2010   LUMBAR LAMINECTOMY  03/2000;   RE-DO  12/ 2001   L4-5   LUMBAR LAMINECTOMY  2001   MOHS SURGERY  2016   left side of nose    TUBAL LIGATION  09/1999   TUBAL LIGATION  01/1999    Family History  Problem Relation Age of Onset   Rheumatic fever Mother 24       Rheumatic heart disease - she died in childbirth;   Early death Mother    Hearing loss Mother    Heart disease Mother    Heart attack Mother    Hypertension Father    COPD Father        Also paternal uncle   Alcohol abuse Father    Kidney disease Father    Polycythemia Sister    Depression Sister    Coronary artery disease Paternal Grandmother    Heart attack Paternal Grandmother    Heart disease Paternal Grandmother    Depression Sister    Lung cancer Paternal Uncle        As well as 3 maternal aunts   Alcohol abuse Maternal Grandfather    COPD Maternal Grandfather    Diabetes Maternal Grandfather    Stroke Maternal Grandfather    Alcohol abuse Paternal Grandfather    Mental illness Paternal Grandfather    Lung cancer Maternal Aunt    Brain cancer Maternal Aunt      Medications- reviewed and updated Current Outpatient Medications  Medication Sig Dispense Refill   aspirin EC 81 MG tablet Take 81 mg by mouth daily.     Calcium Citrate-Vitamin D (CALCIUM CITRATE + D3 PO) Take by mouth.     clonazePAM (KLONOPIN) 1 MG tablet TAKE 1 TABLET BY MOUTH THREE TIMES A DAY AS NEEDED FOR ANXIETY 90 tablet 5   escitalopram (LEXAPRO) 10 MG tablet TAKE 1 TABLET BY MOUTH EVERY DAY 90 tablet 2   estradiol (ESTRACE) 0.5 MG tablet Take 1 tablet (0.5 mg total) by mouth daily. 90 tablet 1   Garlic 1000 MG CAPS Take 1,000 mg by mouth daily.     KRILL OIL PO Take 1 capsule by mouth daily. 300 MG     lisinopril (ZESTRIL) 10 MG tablet TAKE 1 TABLET BY MOUTH EVERY DAY IN THE EVENING 90 tablet 1   Loratadine 10 MG CAPS Take by mouth.     medroxyPROGESTERone (PROVERA) 2.5 MG tablet Take 1 tablet (2.5 mg total) by mouth daily. 90 tablet 1   Misc Natural Products (GLUCOSAMINE CHOND COMPLEX/MSM) TABS      Multiple Vitamin (MULTIVITAMIN WITH MINERALS) TABS tablet Take 1 tablet by mouth daily.     Red Yeast Rice 600 MG TABS Take 600 mg by mouth daily.     No current facility-administered medications for this visit.    Allergies-reviewed and updated Allergies  Allergen Reactions   Tape Rash    Adhesive     Social History   Socioeconomic History   Marital status: Married    Spouse name: Not on file   Number of children: Not on file   Years of education: Not on file   Highest education level: Bachelor's degree (e.g., BA, AB, BS)  Occupational History   Occupation: Self Employed  Tobacco Use   Smoking status: Former    Current packs/day: 0.00    Average packs/day: 0.5 packs/day for 15.0 years (7.5 ttl pk-yrs)    Types: Cigarettes    Start date: 04/28/1997    Quit date: 04/28/2012    Years since quitting: 11.3   Smokeless tobacco: Never  Vaping Use   Vaping status: Never Used  Substance and Sexual Activity   Alcohol use: Not Currently   Drug use: No   Sexual  activity: Yes    Partners: Male    Birth control/protection: Surgical    Comment: Tubal Ligaton   Other Topics Concern   Not on file  Social History Narrative   She and her husband Rosanne Ashing) recently moved from [facility] down to [place] for better job opportunities. 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 She is now ready to set herself back up working.      - second marriage. No children. Her current husband Rosanne Ashing) has children and grandchildren.Granddaughter just diagnosed with cancer   She is starting to try to go out and exercise with her husband walking on it routinely.   Social Determinants of Health   Financial Resource Strain: Not on file  Food Insecurity: Not on file  Transportation Needs: Not on file  Physical Activity: Not on file  Stress: Not on file  Social Connections: Not on file        Objective:  Physical Exam: BP (!) 112/58   Pulse 60   Temp (!) 97.5 F (36.4 C) (Temporal)   Ht 5' 4.57" (1.64 m)   Wt 207 lb 9.6 oz (94.2 kg)   LMP 03/29/2018   SpO2 98%   BMI 35.01 kg/m   Body mass index is 35.01 kg/m. Wt Readings from Last 3 Encounters:  09/14/23 207 lb 9.6 oz (94.2 kg)  01/15/23 202 lb (91.6 kg)  09/11/22 198 lb (89.8 kg)   Gen: NAD, resting comfortably HEENT: TMs normal bilaterally. OP clear. No thyromegaly noted.  CV: RRR with no murmurs appreciated Pulm: NWOB, CTAB with no crackles, wheezes, or rhonchi GI: Normal bowel sounds present. Soft, Nontender, Nondistended. MSK: no edema, cyanosis, or clubbing noted Skin: warm, dry Neuro: CN2-12 grossly intact. Strength 5/5 in upper and lower extremities. Reflexes symmetric and intact bilaterally.  Psych: Normal affect and thought content     Skie Vitrano M. Jimmey Ralph, MD 09/14/2023 12:05 PM

## 2023-09-14 NOTE — Assessment & Plan Note (Signed)
And pain has still been bothersome but overall manageable.  She has been using Arnica cream and over-the-counter ibuprofen and Tylenol with some improvement.  We did discuss referral to see sports medicine however she declined.  She will let us know if she changes her mind.

## 2023-09-14 NOTE — Assessment & Plan Note (Signed)
Symptoms are still bothersome.  We will increase her Claritin to 10 mg twice daily.  Continue Benadryl as needed as well.  Would consider trial of Singulair if does not improve with this.  May ultimately need referral to allergy if this continues to be an issue.

## 2023-09-15 ENCOUNTER — Encounter: Payer: BC Managed Care – PPO | Admitting: Family Medicine

## 2023-09-15 ENCOUNTER — Encounter: Payer: Self-pay | Admitting: Family Medicine

## 2023-09-15 NOTE — Progress Notes (Signed)
Labs are all stable compared to previous.  Her A1c and cholesterol are very mildly elevated.  The rest of her labs are stable.  Do not need to make any changes to her treatment plan at this time.  She should continue to work on diet and exercise and we can recheck in a year or so.

## 2023-09-16 NOTE — Telephone Encounter (Signed)
Please advise 

## 2023-09-18 NOTE — Telephone Encounter (Signed)
Please see her result note.  Her overall cholesterol levels did not change much since last year however her triglycerides did increase quite a bit.  Typically triglycerides are not a clinical concern until it becomes significantly elevated above 500-600.  It is possible that her diet may have contributed some to this but overall do not think it is a significant change.  She should continue to work on eating healthy diet and exercise and we can recheck everything in a year.

## 2023-10-05 ENCOUNTER — Telehealth: Payer: Self-pay | Admitting: Family Medicine

## 2023-10-05 DIAGNOSIS — Z1211 Encounter for screening for malignant neoplasm of colon: Secondary | ICD-10-CM

## 2023-10-05 NOTE — Telephone Encounter (Signed)
We don't schedule appointments at other offices, however, I have placed the GI referral to [facility].

## 2023-10-05 NOTE — Telephone Encounter (Signed)
Msg sent to Admin pool by pt:  have requested an appointment at the [facility] location of [facility].  I received a message that this has to be scheduled through Dr. Jimmey Ralph.  I called and made an appointment but received the message after doing so. [phone] is the office number for [facility]. Please call me if you have questions.  [phone] (work) or [phone] (cell).   Please advise

## 2023-11-02 ENCOUNTER — Other Ambulatory Visit: Payer: Self-pay | Admitting: *Deleted

## 2023-11-02 NOTE — Telephone Encounter (Signed)
Pharmacy requesting refills  Rx Clonazepam 1mg  Last refill on 05/04/2023 #90 Rf 5

## 2023-11-04 MED ORDER — CLONAZEPAM 1 MG PO TABS: 1.0000 mg | ORAL_TABLET | Freq: Three times a day (TID) | ORAL | 5 refills | Status: DC | PRN

## 2023-12-31 ENCOUNTER — Encounter: Payer: Self-pay | Admitting: Obstetrics and Gynecology

## 2023-12-31 NOTE — Progress Notes (Signed)
 63 y.o. G28P0020 Married Caucasian female here for annual exam. Vaginal dryness/pain with intercourse and low libido. Pt also desires to know whether or not she should discontinue HRTs.   Wants to loose weight.  Walking 3x per week.  Has seen her PCP and she may start medication.  Has anxiety.  Treating with Lexapro  and Clonazepam .  PCP prescribing.  She has done counseling in the past.   Denies suicidal ideation.    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 Husband is a retired airline pilot.   PCP: Kennyth Worth HERO, MD   Patient's last menstrual period was 03/29/2018.           Sexually active: yes  The current method of family planning is BTL/PM.    Menopausal hormone therapy:  Estradiol  and MPA. Exercising: Yes.    Walking.  Tennis.  Smoker: no, former  OB History  Gravida Para Term Preterm AB Living  2    2   SAB IAB Ectopic Multiple Live Births    2      # Outcome Date GA Lbr Len/2nd Weight Sex Type Anes PTL Lv  2 Ectopic           1 Ectopic              HEALTH MAINTENANCE: Last 2 paps: 2017-WNL, 07/22/2021-WNL, HPV- neg History of abnormal Pap or positive HPV: no Mammogram: 09/14/2023-neg birads 1, Cat B Colonoscopy: 01/10/2014-WNL, Due 2025.  Scheduled for this month.  Bone Density: 2009  Result WNL   Immunization History  Administered Date(s) Administered   Influenza, High Dose Seasonal PF 11/24/2011, 12/06/2012, 09/27/2013, 09/21/2014   Influenza, Quadrivalent, Recombinant, Inj, Pf 10/22/2016   Influenza, Seasonal, Injecte, Preservative Fre 10/09/2023   Influenza,inj,Quad PF,6+ Mos 10/23/2016, 11/06/2017, 08/24/2018, 09/30/2019, 10/05/2020, 10/04/2021, 09/26/2022   Influenza-Unspecified 10/05/2020   Moderna Covid-19 Fall Seasonal Vaccine 19yrs & older 09/26/2022   Moderna Covid-19 Vaccine Bivalent Booster 29yrs & up 09/27/2021   PFIZER Comirnaty(Gray Top)Covid-19 Tri-Sucrose Vaccine 04/26/2021   PFIZER(Purple Top)SARS-COV-2 Vaccination 03/09/2020,  03/30/2020, 10/26/2020   Pfizer(Comirnaty)Fall Seasonal Vaccine 12 years and older 10/09/2023   Respiratory Syncytial Virus Vaccine,Recomb Aduvanted(Arexvy) 09/19/2022   Tdap 04/26/2015, 08/14/2018   Zoster Recombinant(Shingrix) 06/07/2021, 08/09/2021      reports that she quit smoking about 11 years ago. Her smoking use included cigarettes. She started smoking about 26 years ago. She has a 7.5 pack-year smoking history. She has never used smokeless tobacco. She reports that she does not currently use alcohol. She reports that she does not use drugs.  Past Medical History:  Diagnosis Date   Abnormal uterine bleeding    Allergic rhinitis    Anxiety    Chronic constipation    03-29-2018 per pt hx fecal retention   Depression    GERD (gastroesophageal reflux disease)    Hiatal hernia    History of basal cell carcinoma (BCC) excision    2016  nasal area   Hyperlipidemia    Hypertension    Infertility, female    OA (osteoarthritis)    left shoulder(spur), left hip(spur), spine   PVC's (premature ventricular contractions)    03-29-2018  per pt since age 32s, hx holter monitor showed pvc's, per pt has never caused sob or chest pain   Restless leg syndrome    Seasonal allergies    Urticaria    Wears glasses     Past Surgical History:  Procedure Laterality Date   CHOLECYSTECTOMY  01/01/2011   DILATATION & CURETTAGE/HYSTEROSCOPY  WITH MYOSURE N/A 04/05/2018   Procedure: DILATATION & CURETTAGE/HYSTEROSCOPY WITH MYOSURE;  Surgeon: Jannis Kate Norris, MD;  Location: [facility];  Service: Gynecology;  Laterality: N/A;   DILATION AND CURETTAGE OF UTERUS  08/1996   w/ suction for missed ab   ENDOMETRIAL ABLATION  2002   per pt cryo endometrial ablation   LAPAROSCOPIC CHOLECYSTECTOMY  12/2010   LUMBAR LAMINECTOMY  03/2000;   RE-DO  12/ 2001   L4-5   LUMBAR LAMINECTOMY  2001   MOHS SURGERY  2016   left side of nose    TUBAL LIGATION  09/1999   TUBAL LIGATION  01/1999     Current Outpatient Medications  Medication Sig Dispense Refill   Apple Cid Vn-Grn Tea-Bit Or-Cr (APPLE CIDER VINEGAR PLUS PO)      Ascorbic Acid (VITAMIN C) 1000 MG tablet Take 1,000 mg by mouth daily.     aspirin EC 81 MG tablet Take 81 mg by mouth daily.     Calcium Citrate-Vitamin D (CALCIUM CITRATE + D3 PO) Take by mouth.     clonazePAM  (KLONOPIN ) 1 MG tablet Take 1 tablet (1 mg total) by mouth 3 (three) times daily as needed for anxiety. 90 tablet 5   escitalopram  (LEXAPRO ) 10 MG tablet TAKE 1 TABLET BY MOUTH EVERY DAY 90 tablet 2   estradiol  (ESTRACE ) 0.5 MG tablet Take 1 tablet (0.5 mg total) by mouth daily. (Patient taking differently: Take 1 mg by mouth daily.) 90 tablet 1   Garlic 1000 MG CAPS Take 1,000 mg by mouth daily.     KRILL OIL PO Take 1 capsule by mouth daily. 300 MG     lisinopril  (ZESTRIL ) 10 MG tablet TAKE 1 TABLET BY MOUTH EVERY DAY IN THE EVENING 90 tablet 1   Loratadine 10 MG CAPS Take by mouth.     medroxyPROGESTERone  (PROVERA ) 2.5 MG tablet Take 1 tablet (2.5 mg total) by mouth daily. 90 tablet 1   MELATONIN PO Take by mouth.     Misc Natural Products (GLUCOSAMINE CHOND COMPLEX/MSM) TABS      Multiple Vitamin (MULTIVITAMIN WITH MINERALS) TABS tablet Take 1 tablet by mouth daily.     Multiple Vitamins-Minerals (EQL PROTECTAVISION PO) Take by mouth.     Multiple Vitamins-Minerals (LUTEIN-ZEAXANTHIN PO) Take by mouth.     Red Yeast Rice 600 MG TABS Take 600 mg by mouth daily.     No current facility-administered medications for this visit.    ALLERGIES: Tape  Family History  Problem Relation Age of Onset   Rheumatic fever Mother 30       Rheumatic heart disease - she died in childbirth;   Early death Mother    Hearing loss Mother    Heart disease Mother    Heart attack Mother    Hypertension Father    COPD Father        Also paternal uncle   Alcohol abuse Father    Kidney disease Father    Polycythemia Sister    Depression Sister    Coronary  artery disease Paternal Grandmother    Heart attack Paternal Grandmother    Heart disease Paternal Grandmother    Depression Sister    Lung cancer Paternal Uncle        As well as 3 maternal aunts   Alcohol abuse Maternal Grandfather    COPD Maternal Grandfather    Diabetes Maternal Grandfather    Stroke Maternal Grandfather    Alcohol abuse Paternal Actor  Mental illness Paternal Grandfather    Lung cancer Maternal Aunt    Brain cancer Maternal Aunt     Review of Systems  Genitourinary:  Positive for dyspareunia.  All other systems reviewed and are negative.   PHYSICAL EXAM:  BP 112/82   Pulse 78   Ht 5' 4.5 (1.638 m)   Wt 216 lb (98 kg)   LMP 03/29/2018   SpO2 96%   BMI 36.50 kg/m     General appearance: alert, cooperative and appears stated age Head: normocephalic, without obvious abnormality, atraumatic Neck: no adenopathy, supple, symmetrical, trachea midline and thyroid  normal to inspection and palpation Lungs: clear to auscultation bilaterally Breasts: normal appearance, no masses or tenderness, No nipple retraction or dimpling, No nipple discharge or bleeding, No axillary adenopathy Heart: regular rate and rhythm Abdomen: soft, non-tender; no masses, no organomegaly Extremities: extremities normal, atraumatic, no cyanosis or edema Skin: skin color, texture, turgor normal. No rashes or lesions Lymph nodes: cervical, supraclavicular, and axillary nodes normal. Neurologic: grossly normal  Pelvic: External genitalia:  no lesions              No abnormal inguinal nodes palpated.              Urethra:  normal appearing urethra with no masses, tenderness or lesions              Bartholins and Skenes: normal                 Vagina: normal appearing vagina with normal color and discharge, no lesions              Cervix: no lesions              Pap taken: no Bimanual Exam:  Uterus:  normal size, contour, position, consistency, mobility, non-tender               Adnexa: no mass, fullness, tenderness              Rectal exam: yes.  Confirms.              Anus:  normal sphincter tone, no lesions  Chaperone was present for exam:  Waddell MATSU, CMA  ASSESSMENT: Well woman visit with gynecologic exam. Status post endometrial ablation.  Status post BTL.  HRT.   PLAN: Mammogram screening discussed. Self breast awareness reviewed. Pap and HRV collected:  no.  Due in 2027. Guidelines for Calcium, Vitamin D, regular exercise program including cardiovascular and weight bearing exercise. Medication refills:  Vagifem .  Instructed in use.  I discussed potential effect on breast cancer.  Discused WHI and use of HRT which can increase risk of PE, DVT, MI, stroke and breast cancer.  Benefits of treating vasomotor symptoms and reducing risk of osteoporosis reviewed.  She will now stop HRT. Follow up:  1 year and prn.

## 2024-01-04 ENCOUNTER — Encounter: Payer: Self-pay | Admitting: Obstetrics and Gynecology

## 2024-01-04 ENCOUNTER — Ambulatory Visit (INDEPENDENT_AMBULATORY_CARE_PROVIDER_SITE_OTHER): Payer: 59 | Admitting: Obstetrics and Gynecology

## 2024-01-04 VITALS — BP 112/82 | HR 78 | Ht 64.5 in | Wt 216.0 lb

## 2024-01-04 DIAGNOSIS — Z01419 Encounter for gynecological examination (general) (routine) without abnormal findings: Secondary | ICD-10-CM | POA: Diagnosis not present

## 2024-01-04 DIAGNOSIS — N952 Postmenopausal atrophic vaginitis: Secondary | ICD-10-CM

## 2024-01-04 MED ORDER — ESTRADIOL 10 MCG VA TABS: 1.0000 | ORAL_TABLET | VAGINAL | 3 refills | Status: DC

## 2024-01-04 NOTE — Patient Instructions (Signed)

## 2024-01-12 ENCOUNTER — Encounter: Payer: Self-pay | Admitting: Obstetrics and Gynecology

## 2024-01-12 NOTE — Telephone Encounter (Signed)
 Spoke with patient.  Last dose of vagifem  01/11/24.  Dizziness and nausea continuing through morning. OTC Bonine is helping, not relieving symptoms. Vomiting x1 early. Is drinking fluids. Denies fever/chills or pain.   Instructed patient to stop vagifem . F/u with PCP for further evaluation of symptoms. ER if symptoms worsen or new symptoms develop. Return call to provide update to Dr. Nikki once evaluation complete. I will provide update to Dr. Nikki and f/u with any additional recommendations. Patient agreeable.   Routing to Dr. Nikki.

## 2024-01-13 NOTE — Telephone Encounter (Signed)
 MyChart message unread.  Call placed to patient, no answer, mailbox full, unable to leave message.

## 2024-01-21 ENCOUNTER — Ambulatory Visit: Payer: BC Managed Care – PPO | Admitting: Family Medicine

## 2024-01-25 ENCOUNTER — Telehealth: Payer: 59 | Admitting: Family Medicine

## 2024-02-01 ENCOUNTER — Encounter: Payer: Self-pay | Admitting: Family Medicine

## 2024-02-01 ENCOUNTER — Telehealth (INDEPENDENT_AMBULATORY_CARE_PROVIDER_SITE_OTHER): Payer: 59 | Admitting: Family Medicine

## 2024-02-01 VITALS — Ht 64.5 in

## 2024-02-01 DIAGNOSIS — I10 Essential (primary) hypertension: Secondary | ICD-10-CM | POA: Diagnosis not present

## 2024-02-01 DIAGNOSIS — Z6836 Body mass index (BMI) 36.0-36.9, adult: Secondary | ICD-10-CM

## 2024-02-01 DIAGNOSIS — E669 Obesity, unspecified: Secondary | ICD-10-CM | POA: Diagnosis not present

## 2024-02-01 MED ORDER — ZEPBOUND 2.5 MG/0.5ML ~~LOC~~ SOAJ: 2.5000 mg | SUBCUTANEOUS | 0 refills | Status: DC

## 2024-02-01 NOTE — Progress Notes (Signed)
   Alice Hodges is a 63 y.o. female who presents today for a virtual office visit.  Assessment/Plan:  Chronic Problems Addressed Today: Obesity Had lengthy discussion with patient today regarding weight loss.  She is working on diet and exercise however has not had much success with weight loss.  BMI is 36.5 with comorbidities.  She would be a great candidate for GLP-1 agonist given her BMI and comorbidities.  We we will start Zepbound 2.5 mg weekly.  We discussed potential side effects.  She will follow-up with [place] in a few weeks and if adjust the dose as tolerated.     Subjective:  HPI:  See Assessment / plan for status of chronic conditions.  Patient here to discuss weight loss.  Has a history of obesity.  She is working on lifestyle interventions however has had difficulty with losing weight.  She is interested in potentially starting a medication to help with this.       Objective/Observations  Physical Exam: Today's Vitals   02/01/24 1340  Height: 5' 4.5" (1.638 m)   Body mass index is 36.5 kg/m.  Gen: NAD, resting comfortably Pulm: Normal work of breathing Neuro: Grossly normal, moves all extremities Psych: Normal affect and thought content  Virtual Visit via Video   I connected with Clelia Schaumann on 02/01/24 at  1:40 PM EST by a video enabled telemedicine application and verified that I am speaking with the correct person using two identifiers. The limitations of evaluation and management by telemedicine and the availability of in person appointments were discussed. The patient expressed understanding and agreed to proceed.   Patient location: Home Provider location: [place] [facility] [employer] [facility] Persons participating in the virtual visit: Myself and Patient     Katina Degree. Jimmey Ralph, MD 02/01/2024 2:14 PM

## 2024-02-01 NOTE — Assessment & Plan Note (Signed)
Had lengthy discussion with patient today regarding weight loss.  She is working on diet and exercise however has not had much success with weight loss.  BMI is 36.5 with comorbidities.  She would be a great candidate for GLP-1 agonist given her BMI and comorbidities.  We we will start Zepbound 2.5 mg weekly.  We discussed potential side effects.  She will follow-up with [place] in a few weeks and if adjust the dose as tolerated.

## 2024-02-02 NOTE — Telephone Encounter (Signed)
I appreciate the update. Has she checked with insurance to see if they have any alternatives?  Alice Hodges. Jimmey Ralph, MD 02/02/2024 3:21 PM

## 2024-02-02 NOTE — Telephone Encounter (Signed)
 See note

## 2024-02-04 NOTE — Telephone Encounter (Signed)
 See note

## 2024-02-10 NOTE — Telephone Encounter (Signed)
Noted

## 2024-04-22 ENCOUNTER — Other Ambulatory Visit: Payer: Self-pay | Admitting: Family Medicine

## 2024-05-03 ENCOUNTER — Other Ambulatory Visit: Payer: Self-pay | Admitting: Family Medicine

## 2024-05-22 ENCOUNTER — Other Ambulatory Visit: Payer: Self-pay | Admitting: Family Medicine

## 2024-09-15 ENCOUNTER — Ambulatory Visit (INDEPENDENT_AMBULATORY_CARE_PROVIDER_SITE_OTHER): Payer: BC Managed Care – PPO | Admitting: Family Medicine

## 2024-09-15 ENCOUNTER — Encounter: Payer: Self-pay | Admitting: Family Medicine

## 2024-09-15 VITALS — BP 132/83 | HR 65 | Temp 97.5°F | Ht 64.5 in | Wt 216.2 lb

## 2024-09-15 DIAGNOSIS — Z0001 Encounter for general adult medical examination with abnormal findings: Secondary | ICD-10-CM | POA: Diagnosis not present

## 2024-09-15 DIAGNOSIS — E785 Hyperlipidemia, unspecified: Secondary | ICD-10-CM | POA: Diagnosis not present

## 2024-09-15 DIAGNOSIS — F321 Major depressive disorder, single episode, moderate: Secondary | ICD-10-CM | POA: Diagnosis not present

## 2024-09-15 DIAGNOSIS — I10 Essential (primary) hypertension: Secondary | ICD-10-CM | POA: Diagnosis not present

## 2024-09-15 DIAGNOSIS — F419 Anxiety disorder, unspecified: Secondary | ICD-10-CM

## 2024-09-15 DIAGNOSIS — Z131 Encounter for screening for diabetes mellitus: Secondary | ICD-10-CM

## 2024-09-15 LAB — COMPREHENSIVE METABOLIC PANEL WITH GFR
ALT: 39 U/L — ABNORMAL HIGH (ref 0–35)
AST: 34 U/L (ref 0–37)
Albumin: 4.2 g/dL (ref 3.5–5.2)
Alkaline Phosphatase: 74 U/L (ref 39–117)
BUN: 15 mg/dL (ref 6–23)
CO2: 29 meq/L (ref 19–32)
Calcium: 10.2 mg/dL (ref 8.4–10.5)
Chloride: 103 meq/L (ref 96–112)
Creatinine, Ser: 0.79 mg/dL (ref 0.40–1.20)
GFR: 79.59 mL/min (ref >60.00)
Glucose, Bld: 98 mg/dL (ref 70–99)
Potassium: 4.1 meq/L (ref 3.5–5.1)
Sodium: 140 meq/L (ref 135–145)
Total Bilirubin: 0.5 mg/dL (ref 0.2–1.2)
Total Protein: 7 g/dL (ref 6.0–8.3)

## 2024-09-15 LAB — CBC
HCT: 42.1 % (ref 36.0–46.0)
Hemoglobin: 14.1 g/dL (ref 12.0–15.0)
MCHC: 33.6 g/dL (ref 30.0–36.0)
MCV: 87.2 fl (ref 78.0–100.0)
Platelets: 253 K/uL (ref 150.0–400.0)
RBC: 4.83 Mil/uL (ref 3.87–5.11)
RDW: 13.2 % (ref 11.5–15.5)
WBC: 5.5 K/uL (ref 4.0–10.5)

## 2024-09-15 LAB — LIPID PANEL
Cholesterol: 210 mg/dL — ABNORMAL HIGH (ref 0–200)
HDL: 47 mg/dL (ref >39.00)
LDL Cholesterol: 113 mg/dL — ABNORMAL HIGH (ref 0–99)
NonHDL: 163.23
Total CHOL/HDL Ratio: 4
Triglycerides: 253 mg/dL — ABNORMAL HIGH (ref 0.0–149.0)
VLDL: 50.6 mg/dL — ABNORMAL HIGH (ref 0.0–40.0)

## 2024-09-15 LAB — TSH: TSH: 0.82 u[IU]/mL (ref 0.35–5.50)

## 2024-09-15 LAB — HEMOGLOBIN A1C: Hgb A1c MFr Bld: 6 % (ref 4.6–6.5)

## 2024-09-15 NOTE — Assessment & Plan Note (Signed)
 Check lipids

## 2024-09-15 NOTE — Assessment & Plan Note (Signed)
 Worsened recently due to passing of her son as above.  She is current on Lexapro  10 mg daily.  We discussed changing dose however she would like to continue for now.  She will let us  know if she needs any further assistance.

## 2024-09-15 NOTE — Assessment & Plan Note (Signed)
 Blood pressure at goal today on lisinopril  10 mg daily.  Check labs.

## 2024-09-15 NOTE — Assessment & Plan Note (Signed)
 Worsened recently as above.  Will continue her current regimen Lexapro  10 mg daily clonazepam  1 mg 3 times daily as needed.

## 2024-09-15 NOTE — Progress Notes (Signed)
 Chief Complaint:  Alice Hodges is a 63 y.o. female who presents today for her annual comprehensive physical exam.    Assessment/Plan:  New/Acute Problems: Grief/bereavement Patient's stepson passed a month ago which has been extremely difficult for her and her husband.  She does have good support with her husband and family members however still has intense feelings of loss and improvement.  She is currently on clonazepam  and Lexapro  for her anxiety and depression.  We did discuss referral for her to see counselor however she like to hold off on this for now.  She will let us  know if she changes her mind or needs any further assistance.  Chronic Problems Addressed Today: Depression, major, single episode, moderate (HCC) Worsened recently due to passing of her son as above.  She is current on Lexapro  10 mg daily.  We discussed changing dose however she would like to continue for now.  She will let us  know if she needs any further assistance.  Dyslipidemia Check lipids.  Essential hypertension Blood pressure at goal today on lisinopril  10 mg daily.  Check labs.  Anxiety Worsened recently as above.  Will continue her current regimen Lexapro  10 mg daily clonazepam  1 mg 3 times daily as needed.  Preventative Healthcare: Check labs.  Due for next colonoscopy 2035.  Patient Counseling(The following topics were reviewed and/or handout was given):  -Nutrition: Stressed importance of moderation in sodium/caffeine intake, saturated fat and cholesterol, caloric balance, sufficient intake of fresh fruits, vegetables, and fiber.  -Stressed the importance of regular exercise.   -Substance Abuse: Discussed cessation/primary prevention of tobacco, alcohol, or other drug use; driving or other dangerous activities under the influence; availability of treatment for abuse.   -Injury prevention: Discussed safety belts, safety helmets, smoke detector, smoking near bedding or upholstery.   -Sexuality:  Discussed sexually transmitted diseases, partner selection, use of condoms, avoidance of unintended pregnancy and contraceptive alternatives.   -Dental health: Discussed importance of regular tooth brushing, flossing, and dental visits.  -Health maintenance and immunizations reviewed. Please refer to Health maintenance section.  Return to care in 1 year for next preventative visit.     Subjective:  HPI:  She has no acute complaints today. Patient is here today for her annual physical.  See assessment / plan for status of chronic conditions.  Discussed the use of AI scribe software for clinical note transcription with the patient, who gave verbal consent to proceed.  History of Present Illness Alice Hodges is a 63 year old female who presents for an annual physical exam.  She is experiencing significant grief following the suicide of her stepson, Alice Hodges, one month ago. She describes intense feelings of loss and sorrow. She has a history of depression and has previously undergone grief therapy following the loss of two pregnancies. She feels a strong maternal bond with Alice Hodges, despite others referring to him as her stepson.  She is currently taking Lexapro  at night and clonazepam  three times a day to manage her stress and anxiety. No trouble sleeping, although her husband is experiencing some difficulty falling asleep. She has noticed a loss of appetite and difficulty maintaining focus, which she associates with her previous experiences of depression.  She and her husband are in the process of moving, which adds to her stress. 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  She has a supportive network, including her husband's sister, who has experienced significant loss herself. She also has access to counseling services and  mental health programs through her workplace.  She is not currently engaging in regular physical activity, although she plans to resume walking and yoga after moving.       09/15/2024   12:33 PM  Depression screen PHQ 2/9  Decreased Interest 3  Down, Depressed, Hopeless 3  PHQ - 2 Score 6  Altered sleeping 1  Tired, decreased energy 1  Change in appetite 1  Feeling bad or failure about yourself  0  Trouble concentrating 1  Moving slowly or fidgety/restless 0  Suicidal thoughts 0  PHQ-9 Score 10  Difficult doing work/chores Somewhat difficult    Health Maintenance Due  Topic Date Due   HIV Screening  Never done   Mammogram  09/13/2024     ROS: Per HPI, otherwise a complete review of systems was negative.   PMH:  The following were reviewed and entered/updated in epic: Past Medical History:  Diagnosis Date   Abnormal uterine bleeding    Allergic rhinitis    Anxiety    Chronic constipation    03-29-2018 per pt hx fecal retention   Depression    GERD (gastroesophageal reflux disease)    Hiatal hernia    History of basal cell carcinoma (BCC) excision    2016  nasal area   Hyperlipidemia    Hypertension    Infertility, female    OA (osteoarthritis)    left shoulder(spur), left hip(spur), spine   PVC's (premature ventricular contractions)    03-29-2018  per pt since age 42s, hx holter monitor showed pvc's, per pt has never caused sob or chest pain   Restless leg syndrome    Seasonal allergies    Urticaria    Wears glasses    Patient Active Problem List   Diagnosis Date Noted   Depression, major, single episode, moderate (HCC) 09/14/2023   Osteoarthritis 09/14/2023   Urticaria 09/14/2023   Obesity 09/14/2023   Heat sensitivity 05/29/2021   Constipation 05/20/2021   GERD (gastroesophageal reflux disease) 03/15/2019   Gout 03/15/2019   Menopause 04/05/2018   Anxiety 02/15/2018   Skin lesions 12/03/2017   Restless leg syndrome 11/06/2017   Chest pain with  moderate risk for cardiac etiology 04/30/2017   Abnormal finding on EKG 04/30/2017   Dyslipidemia 04/30/2017   Essential hypertension 04/30/2017   AC joint arthropathy 10/29/2016   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   Allergic rhinitis 11/24/2011   Degenerative disc disease, lumbar 07/31/2009   Past Surgical History:  Procedure Laterality Date   CHOLECYSTECTOMY  01/01/2011   DILATATION & CURETTAGE/HYSTEROSCOPY WITH MYOSURE N/A 04/05/2018   Procedure: DILATATION & CURETTAGE/HYSTEROSCOPY WITH MYOSURE;  Surgeon: Jannis Kate Norris, MD;  Location: [facility];  Service: Gynecology;  Laterality: N/A;   DILATION AND CURETTAGE OF UTERUS  08/1996   w/ suction for missed ab   ENDOMETRIAL ABLATION  2002   per pt cryo endometrial ablation   LAPAROSCOPIC CHOLECYSTECTOMY  12/2010   LUMBAR LAMINECTOMY  03/2000;   RE-DO  12/ 2001   L4-5   LUMBAR LAMINECTOMY  2001   MOHS SURGERY  2016   left side of nose    TUBAL LIGATION  09/1999   TUBAL LIGATION  01/1999    Family History  Problem Relation Age of Onset   Rheumatic fever Mother 38       Rheumatic heart disease -  she died in childbirth;   Early death Mother    Hearing loss Mother    Heart disease Mother    Heart attack Mother    Hypertension Father    COPD Father        Also paternal uncle   Alcohol abuse Father    Kidney disease Father    Polycythemia Sister    Depression Sister    Coronary artery disease Paternal Grandmother    Heart attack Paternal Grandmother    Heart disease Paternal Grandmother    Depression Sister    Lung cancer Paternal Uncle        As well as 3 maternal aunts   Alcohol abuse Maternal Grandfather    COPD Maternal Grandfather    Diabetes Maternal Grandfather    Stroke Maternal Grandfather    Alcohol abuse Paternal Grandfather    Mental illness Paternal Grandfather    Lung cancer Maternal Aunt    Brain cancer Maternal Aunt     Medications- reviewed and updated Current Outpatient  Medications  Medication Sig Dispense Refill   aspirin EC 81 MG tablet Take 81 mg by mouth daily.     Calcium Citrate-Vitamin D (CALCIUM CITRATE + D3 PO) Take by mouth.     clonazePAM  (KLONOPIN ) 1 MG tablet TAKE 1 TABLET BY MOUTH 3 TIMES DAILY AS NEEDED FOR ANXIETY. 90 tablet 5   escitalopram  (LEXAPRO ) 10 MG tablet TAKE 1 TABLET BY MOUTH EVERY DAY 90 tablet 1   Garlic 1000 MG CAPS Take 1,000 mg by mouth daily.     lisinopril  (ZESTRIL ) 10 MG tablet TAKE 1 TABLET BY MOUTH EVERY DAY IN THE EVENING 90 tablet 1   Loratadine 10 MG CAPS Take by mouth.     MELATONIN PO Take by mouth.     Misc Natural Products (GLUCOSAMINE CHOND COMPLEX/MSM) TABS      Multiple Vitamin (MULTIVITAMIN WITH MINERALS) TABS tablet Take 1 tablet by mouth daily.     Multiple Vitamins-Minerals (EQL PROTECTAVISION PO) Take by mouth.     Multiple Vitamins-Minerals (LUTEIN-ZEAXANTHIN PO) Take by mouth.     tirzepatide  (ZEPBOUND ) 2.5 MG/0.5ML Pen Inject 2.5 mg into the skin once a week. 2 mL 0   No current facility-administered medications for this visit.    Allergies-reviewed and updated Allergies  Allergen Reactions   Tape Rash    Adhesive     Social History   Socioeconomic History   Marital status: Married    Spouse name: Not on file   Number of children: Not on file   Years of education: Not on file   Highest education level: Bachelor's degree (e.g., BA, AB, BS)  Occupational History   Occupation: Self Employed  Tobacco Use   Smoking status: Former    Current packs/day: 0.00    Average packs/day: 0.5 packs/day for 15.0 years (7.5 ttl pk-yrs)    Types: Cigarettes    Start date: 04/28/1997    Quit date: 04/28/2012    Years since quitting: 12.3   Smokeless tobacco: Never  Vaping Use   Vaping status: Never Used  Substance and Sexual Activity   Alcohol use: Not Currently   Drug use: No   Sexual activity: Yes    Partners: Male    Birth control/protection: Surgical, Post-menopausal    Comment: Tubal Ligaton    Other Topics Concern   Not on file  Social History Narrative   She and her husband Daisey) recently moved from [place]  down to [place] for  better job opportunities. 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 She is now ready to set herself back up working.      - second marriage. No children. Her current husband Daisey) has children and grandchildren.Granddaughter just diagnosed with cancer   She is starting to try to go out and exercise with her husband walking on it routinely.   Social Drivers of Corporate investment banker Strain: Not on file  Food Insecurity: Not on file  Transportation Needs: Not on file  Physical Activity: Not on file  Stress: Not on file  Social Connections: Not on file        Objective:  Physical Exam: BP 132/83   Pulse 65   Temp (!) 97.5 F (36.4 C) (Temporal)   Ht 5' 4.5 (1.638 m)   Wt 216 lb 3.2 oz (98.1 kg)   LMP 03/29/2018   SpO2 96%   BMI 36.54 kg/m   Body mass index is 36.54 kg/m. Wt Readings from Last 3 Encounters:  09/15/24 216 lb 3.2 oz (98.1 kg)  01/04/24 216 lb (98 kg)  09/14/23 207 lb 9.6 oz (94.2 kg)   Gen: NAD, resting comfortably HEENT: TMs normal bilaterally. OP clear. No thyromegaly noted.  CV: RRR with no murmurs appreciated Pulm: NWOB, CTAB with no crackles, wheezes, or rhonchi GI: Normal bowel sounds present. Soft, Nontender, Nondistended. MSK: no edema, cyanosis, or clubbing noted Skin: warm, dry Neuro: CN2-12 grossly intact. Strength 5/5 in upper and lower extremities. Reflexes symmetric and intact bilaterally.  Psych: Normal affect and thought content     Heba Ige M. Kennyth, MD 09/15/2024 12:37 PM

## 2024-09-15 NOTE — Patient Instructions (Signed)
 It was very nice to see you today!  VISIT SUMMARY: Today, you had your annual physical exam. We discussed your recent grief following your stepson's passing, your history of depression, and your current medications. We also reviewed your general health and wellness.  YOUR PLAN: BEREAVEMENT AND GRIEF REACTION: You are experiencing intense grief and loss after your stepson's passing. -Continue taking clonazepam  three times daily. -Monitor for any signs of depression. -Consider taking additional time off work if needed.  MAJOR DEPRESSIVE DISORDER, RECURRENT, IN REMISSION: Your major depressive disorder is currently in remission with no depressive symptoms. -Continue taking Lexapro  as prescribed. -Monitor for any signs of depression, especially due to recent bereavement.  ADULT WELLNESS VISIT: This was your routine wellness visit. -We will conduct blood work. -Follow up in a year unless needed sooner.   Return in about 1 year (around 09/15/2025) for Annual Physical.   Take care, Dr Kennyth  PLEASE NOTE:  If you had any lab tests, please let us  know if you have not heard back within a few days. You may see your results on mychart before we have a chance to review them but we will give you a call once they are reviewed by us .   If we ordered any referrals today, please let us  know if you have not heard from their office within the next week.   If you had any urgent prescriptions sent in today, please check with the pharmacy within an hour of our visit to make sure the prescription was transmitted appropriately.   Please try these tips to maintain a healthy lifestyle:  Eat at least 3 REAL meals and 1-2 snacks per day.  Aim for no more than 5 hours between eating.  If you eat breakfast, please do so within one hour of getting up.   Each meal should contain half fruits/vegetables, one quarter protein, and one quarter carbs (no bigger than a computer mouse)  Cut down on sweet beverages.  This includes juice, soda, and sweet tea.   Drink at least 1 glass of water with each meal and aim for at least 8 glasses per day  Exercise at least 150 minutes every week.     Preventive Care 47-88 Years Old, Female Preventive care refers to lifestyle choices and visits with your health care provider that can promote health and wellness. Preventive care visits are also called wellness exams. What can I expect for my preventive care visit? Counseling Your health care provider may ask you questions about your: Medical history, including: Past medical problems. Family medical history. Pregnancy history. Current health, including: Menstrual cycle. Method of birth control. Emotional well-being. Home life and relationship well-being. Sexual activity and sexual health. Lifestyle, including: Alcohol, nicotine or tobacco, and drug use. Access to firearms. Diet, exercise, and sleep habits. Work and work Astronomer. Sunscreen use. Safety issues such as seatbelt and bike helmet use. Physical exam Your health care provider will check your: Height and weight. These may be used to calculate your BMI (body mass index). BMI is a measurement that tells if you are at a healthy weight. Waist circumference. This measures the distance around your waistline. This measurement also tells if you are at a healthy weight and may help predict your risk of certain diseases, such as type 2 diabetes and high blood pressure. Heart rate and blood pressure. Body temperature. Skin for abnormal spots. What immunizations do I need?  Vaccines are usually given at various ages, according to a schedule. Your health care  provider will recommend vaccines for you based on your age, medical history, and lifestyle or other factors, such as travel or where you work. What tests do I need? Screening Your health care provider may recommend screening tests for certain conditions. This may include: Lipid and cholesterol  levels. Diabetes screening. This is done by checking your blood sugar (glucose) after you have not eaten for a while (fasting). Pelvic exam and Pap test. Hepatitis B test. Hepatitis C test. HIV (human immunodeficiency virus) test. STI (sexually transmitted infection) testing, if you are at risk. Lung cancer screening. Colorectal cancer screening. Mammogram. Talk with your health care provider about when you should start having regular mammograms. This may depend on whether you have a family history of breast cancer. BRCA-related cancer screening. This may be done if you have a family history of breast, ovarian, tubal, or peritoneal cancers. Bone density scan. This is done to screen for osteoporosis. Talk with your health care provider about your test results, treatment options, and if necessary, the need for more tests. Follow these instructions at home: Eating and drinking  Eat a diet that includes fresh fruits and vegetables, whole grains, lean protein, and low-fat dairy products. Take vitamin and mineral supplements as recommended by your health care provider. Do not drink alcohol if: Your health care provider tells you not to drink. You are pregnant, may be pregnant, or are planning to become pregnant. If you drink alcohol: Limit how much you have to 0-1 drink a day. Know how much alcohol is in your drink. In the [place] one drink equals one 12 oz bottle of beer (355 mL), one 5 oz glass of wine (148 mL), or one 1 oz glass of hard liquor (44 mL). Lifestyle Brush your teeth every morning and night with fluoride toothpaste. Floss one time each day. Exercise for at least 30 minutes 5 or more days each week. Do not use any products that contain nicotine or tobacco. These products include cigarettes, chewing tobacco, and vaping devices, such as e-cigarettes. If you need help quitting, ask your health care provider. Do not use drugs. If you are sexually active, practice safe sex. Use a  condom or other form of protection to prevent STIs. If you do not wish to become pregnant, use a form of birth control. If you plan to become pregnant, see your health care provider for a prepregnancy visit. Take aspirin only as told by your health care provider. Make sure that you understand how much to take and what form to take. Work with your health care provider to find out whether it is safe and beneficial for you to take aspirin daily. Find healthy ways to manage stress, such as: Meditation, yoga, or listening to music. Journaling. Talking to a trusted person. Spending time with friends and family. Minimize exposure to UV radiation to reduce your risk of skin cancer. Safety Always wear your seat belt while driving or riding in a vehicle. Do not drive: If you have been drinking alcohol. Do not ride with someone who has been drinking. When you are tired or distracted. While texting. If you have been using any mind-altering substances or drugs. Wear a helmet and other protective equipment during sports activities. If you have firearms in your house, make sure you follow all gun safety procedures. Seek help if you have been physically or sexually abused. What's next? Visit your health care provider once a year for an annual wellness visit. Ask your health care provider how often you  should have your eyes and teeth checked. Stay up to date on all vaccines. This information is not intended to replace advice given to you by your health care provider. Make sure you discuss any questions you have with your health care provider. Document Revised: 06/12/2021 Document Reviewed: 06/12/2021 Elsevier Patient Education  2024 [employer].

## 2024-09-16 ENCOUNTER — Ambulatory Visit: Payer: Self-pay | Admitting: Family Medicine

## 2024-09-16 DIAGNOSIS — E785 Hyperlipidemia, unspecified: Secondary | ICD-10-CM

## 2024-09-16 NOTE — Progress Notes (Signed)
 Her cholesterol and A1c are a little bit higher than last year.  She does not need to start medications for either of these however she should work on diet and exercise and we can recheck again in a year or so.  One of her liver numbers was slightly elevated.  This is probably not significant however I would like for her to come back to recheck in a few weeks.  Please place future order for CMET.  We can recheck everything else in a year.

## 2024-09-19 NOTE — Telephone Encounter (Signed)
 Last read by Elveria LITTIE Karon Niels at 5:38PM on 09/18/2024. See note

## 2024-09-20 NOTE — Telephone Encounter (Signed)
 I appreciate the update.  Small cyst like that are usually benign and should not cause any sort of issue.  There are several possible things that can cause slight elevation in her liver numbers, the majority of which are temporary and do not cause any issues.  I think coming back in mid November for repeat labs is a good idea.  She should let us  know if she develops any symptoms between now and then.

## 2024-09-22 NOTE — Telephone Encounter (Signed)
 See note

## 2024-09-27 ENCOUNTER — Other Ambulatory Visit: Payer: Self-pay | Admitting: Family Medicine

## 2024-09-27 DIAGNOSIS — Z1231 Encounter for screening mammogram for malignant neoplasm of breast: Secondary | ICD-10-CM

## 2024-10-15 ENCOUNTER — Other Ambulatory Visit: Payer: Self-pay | Admitting: Family Medicine

## 2024-10-24 ENCOUNTER — Other Ambulatory Visit: Payer: Self-pay | Admitting: *Deleted

## 2024-10-24 ENCOUNTER — Ambulatory Visit
Admission: RE | Admit: 2024-10-24 | Discharge: 2024-10-24 | Disposition: A | Discharge status: Home / Self Care | Source: Ambulatory Visit

## 2024-10-24 ENCOUNTER — Other Ambulatory Visit

## 2024-10-24 DIAGNOSIS — Z1231 Encounter for screening mammogram for malignant neoplasm of breast: Secondary | ICD-10-CM

## 2024-10-24 DIAGNOSIS — R748 Abnormal levels of other serum enzymes: Secondary | ICD-10-CM

## 2024-10-24 LAB — COMPREHENSIVE METABOLIC PANEL WITH GFR
ALT: 33 U/L (ref 0–35)
AST: 39 U/L — ABNORMAL HIGH (ref 0–37)
Albumin: 4.2 g/dL (ref 3.5–5.2)
Alkaline Phosphatase: 73 U/L (ref 39–117)
BUN: 21 mg/dL (ref 6–23)
CO2: 26 meq/L (ref 19–32)
Calcium: 9.7 mg/dL (ref 8.4–10.5)
Chloride: 106 meq/L (ref 96–112)
Creatinine, Ser: 0.82 mg/dL (ref 0.40–1.20)
GFR: 76.06 mL/min (ref >60.00)
Glucose, Bld: 96 mg/dL (ref 70–99)
Potassium: 4 meq/L (ref 3.5–5.1)
Sodium: 141 meq/L (ref 135–145)
Total Bilirubin: 0.8 mg/dL (ref 0.2–1.2)
Total Protein: 6.6 g/dL (ref 6.0–8.3)

## 2024-10-25 ENCOUNTER — Ambulatory Visit: Payer: Self-pay | Admitting: Family Medicine

## 2024-10-25 NOTE — Progress Notes (Signed)
 Her ALT is back to normal though she did have a slight increase in her AST.  These both measure liver function.  These results are not a cause for any significant concern and are overall reassuring.  We can recheck labs again at her next visit here.  Do not need to do any other testing at this point.

## 2024-10-31 ENCOUNTER — Ambulatory Visit: Admitting: Family Medicine

## 2024-11-05 ENCOUNTER — Other Ambulatory Visit: Payer: Self-pay | Admitting: Family Medicine

## 2024-11-18 ENCOUNTER — Other Ambulatory Visit: Payer: Self-pay | Admitting: Family Medicine

## 2025-01-09 ENCOUNTER — Ambulatory Visit: Payer: 59 | Admitting: Obstetrics and Gynecology

## 2025-01-13 NOTE — Progress Notes (Unsigned)
 "  64 y.o. G63P0020 Married Caucasian female here for annual exam.    Vagifem  caused nausea and vomiting.   Moved into a new home.    Step son committed suicide in August. She has been in counseling.  Her husband has not done counseling.   PCP: Kennyth Worth HERO, MD   Patient's last menstrual period was 03/29/2018.           Sexually active: Yes.    The current method of family planning is tubal ligation.    Menopausal hormone therapy:  n/a Exercising: No.   Smoker:  Former   OB History  Gravida Para Term Preterm AB Living  2    2   SAB IAB Ectopic Multiple Live Births    2      # Outcome Date GA Lbr Len/2nd Weight Sex Type Anes PTL Lv  2 Ectopic           1 Ectopic              HEALTH MAINTENANCE: Last 2 paps:  07/22/21 neg, HR HPV neg, 07/21/16 neg History of abnormal Pap or positive HPV:  no Mammogram:   10/24/24 Breast Density Cat B, BIRADS cat 1 neg  Colonoscopy:  01/15/24 (care everywhere) - [facility]  Bone Density:  02/11/12  Result  normal    Immunization History  Administered Date(s) Administered   INFLUENZA, HIGH DOSE SEASONAL PF 11/24/2011, 12/06/2012, 09/27/2013, 09/21/2014   Influenza, Quadrivalent, Recombinant, Inj, Pf 10/22/2016   Influenza, Seasonal, Injecte, Preservative Fre 10/09/2023   Influenza,inj,Quad PF,6+ Mos 10/23/2016, 11/06/2017, 08/24/2018, 09/30/2019, 10/05/2020, 10/04/2021, 09/26/2022   Influenza-Unspecified 10/05/2020   Moderna Covid-19 Fall Seasonal Vaccine 26yrs & older 09/26/2022   Moderna Covid-19 Vaccine Bivalent Booster 3yrs & up 09/27/2021   PFIZER Comirnaty(Gray Top)Covid-19 Tri-Sucrose Vaccine 04/26/2021   PFIZER(Purple Top)SARS-COV-2 Vaccination 03/09/2020, 03/30/2020, 10/26/2020   Pfizer(Comirnaty)Fall Seasonal Vaccine 12 years and older 10/09/2023   Pneumococcal Conjugate Pcv21, Polysaccharide Crm197 Conjugaf 04/01/2024   Respiratory Syncytial Virus Vaccine,Recomb Aduvanted(Arexvy) 09/19/2022   Tdap 04/26/2015, 08/14/2018    Zoster Recombinant(Shingrix) 06/07/2021, 08/09/2021      reports that she quit smoking about 12 years ago. Her smoking use included cigarettes. She started smoking about 27 years ago. She has a 7.5 pack-year smoking history. She has never used smokeless tobacco. She reports that she does not currently use alcohol. She reports that she does not use drugs.  Past Medical History:  Diagnosis Date   Abnormal uterine bleeding    Allergic rhinitis    Anxiety    Chronic constipation    03-29-2018 per pt hx fecal retention   Depression    GERD (gastroesophageal reflux disease)    Hiatal hernia    History of basal cell carcinoma (BCC) excision    2016  nasal area   Hyperlipidemia    Hypertension    Infertility, female    OA (osteoarthritis)    left shoulder(spur), left hip(spur), spine   PVC's (premature ventricular contractions)    03-29-2018  per pt since age 73s, hx holter monitor showed pvc's, per pt has never caused sob or chest pain   Restless leg syndrome    Seasonal allergies    Urticaria    Wears glasses     Past Surgical History:  Procedure Laterality Date   CHOLECYSTECTOMY  01/01/2011   DILATATION & CURETTAGE/HYSTEROSCOPY WITH MYOSURE N/A 04/05/2018   Procedure: DILATATION & CURETTAGE/HYSTEROSCOPY WITH MYOSURE;  Surgeon: Jannis Kate Norris, MD;  Location: [facility];  Service: Gynecology;  Laterality: N/A;   DILATION AND CURETTAGE OF UTERUS  08/1996   w/ suction for missed ab   ENDOMETRIAL ABLATION  2002   per pt cryo endometrial ablation   LAPAROSCOPIC CHOLECYSTECTOMY  12/2010   LUMBAR LAMINECTOMY  03/2000;   RE-DO  12/ 2001   L4-5   LUMBAR LAMINECTOMY  2001   MOHS SURGERY  2016   left side of nose    TUBAL LIGATION  09/1999   TUBAL LIGATION  01/1999    Current Outpatient Medications  Medication Sig Dispense Refill   Acetaminophen (TYLENOL PO) Take by mouth.     Calcium Citrate-Vitamin D (CALCIUM CITRATE + D3 PO) Take by mouth.     clonazePAM   (KLONOPIN ) 1 MG tablet TAKE 1 TABLET BY MOUTH THREE TIMES A DAY AS NEEDED FOR ANXIETY 90 tablet 1   escitalopram  (LEXAPRO ) 10 MG tablet TAKE 1 TABLET BY MOUTH EVERY DAY 90 tablet 1   Garlic 1000 MG CAPS Take 1,000 mg by mouth daily.     IBUPROFEN PO Take by mouth.     lisinopril  (ZESTRIL ) 10 MG tablet TAKE 1 TABLET BY MOUTH EVERY DAY IN THE EVENING 90 tablet 1   Loratadine 10 MG CAPS Take by mouth.     MELATONIN PO Take by mouth.     Multiple Vitamin (MULTIVITAMIN WITH MINERALS) TABS tablet Take 1 tablet by mouth daily.     Multiple Vitamins-Minerals (EQL PROTECTAVISION PO) Take by mouth.     Multiple Vitamins-Minerals (LUTEIN-ZEAXANTHIN PO) Take by mouth.     No current facility-administered medications for this visit.    ALLERGIES: Tape  Family History  Problem Relation Age of Onset   Rheumatic fever Mother 29       Rheumatic heart disease - she died in childbirth;   Early death Mother    Hearing loss Mother    Heart disease Mother    Heart attack Mother    Hypertension Father    COPD Father        Also paternal uncle   Alcohol abuse Father    Kidney disease Father    Polycythemia Sister    Depression Sister    Depression Sister    Lung cancer Maternal Aunt    Brain cancer Maternal Aunt    Lung cancer Paternal Uncle        As well as 3 maternal aunts   Alcohol abuse Maternal Grandfather    COPD Maternal Grandfather    Diabetes Maternal Grandfather    Stroke Maternal Grandfather    Coronary artery disease Paternal Grandmother    Heart attack Paternal Grandmother    Heart disease Paternal Grandmother    Alcohol abuse Paternal Grandfather    Mental illness Paternal Grandfather    Breast cancer Neg Hx     Review of Systems  All other systems reviewed and are negative.   PHYSICAL EXAM:  BP 116/82 (BP Location: Left Arm, Patient Position: Sitting)   Pulse 79   Ht 5' 5.25 (1.657 m)   Wt 220 lb (99.8 kg)   LMP 03/29/2018   SpO2 95%   BMI 36.33 kg/m     General  appearance: alert, cooperative and appears stated age Head: normocephalic, without obvious abnormality, atraumatic Neck: no adenopathy, supple, symmetrical, trachea midline and thyroid  normal to inspection and palpation Lungs: clear to auscultation bilaterally Breasts: normal appearance, no masses or tenderness, No nipple retraction or dimpling, No nipple discharge or bleeding, No axillary adenopathy Heart: regular rate  and rhythm Abdomen: soft, non-tender; no masses, no organomegaly Extremities: extremities normal, atraumatic, no cyanosis or edema Skin: skin color, texture, turgor normal. No rashes or lesions Lymph nodes: cervical, supraclavicular, and axillary nodes normal. Neurologic: grossly normal  Pelvic: External genitalia:  no lesions              No abnormal inguinal nodes palpated.              Urethra:  normal appearing urethra with no masses, tenderness or lesions              Bartholins and Skenes: normal                 Vagina: normal appearing vagina with normal color and discharge, no lesions              Cervix: no lesions              Pap taken: no Bimanual Exam:  Uterus:  normal size, contour, position, consistency, mobility, non-tender              Adnexa: no mass, fullness, tenderness              Rectal exam: yes.  Confirms.              Anus:  normal sphincter tone, no lesions  Chaperone was present for exam:  Kari HERO, CMA  ASSESSMENT: Well woman visit with gynecologic exam. Hx anxiety.  On Lexapro  and Klonopin .  PHQ-2-9: 6.   Bereavement.   PLAN: Mammogram screening discussed. Self breast awareness reviewed. Pap and HRV collected:  no.  Due in 2027.  Guidelines for Calcium, Vitamin D, regular exercise program including cardiovascular and weight bearing exercise. Medication refills:  NA. Labs with PCP.  Bereavement support given for the loss of her son.   Follow up:  yearly and prn.             "

## 2025-01-16 ENCOUNTER — Ambulatory Visit: Admitting: Obstetrics and Gynecology

## 2025-01-16 ENCOUNTER — Encounter: Payer: Self-pay | Admitting: Obstetrics and Gynecology

## 2025-01-16 VITALS — BP 116/82 | HR 79 | Ht 65.25 in | Wt 220.0 lb

## 2025-01-16 DIAGNOSIS — Z01419 Encounter for gynecological examination (general) (routine) without abnormal findings: Secondary | ICD-10-CM

## 2025-01-16 DIAGNOSIS — Z634 Disappearance and death of family member: Secondary | ICD-10-CM

## 2025-01-16 DIAGNOSIS — Z1331 Encounter for screening for depression: Secondary | ICD-10-CM | POA: Diagnosis not present

## 2025-01-16 NOTE — Patient Instructions (Signed)

## 2025-01-26 ENCOUNTER — Other Ambulatory Visit: Payer: Self-pay | Admitting: Family Medicine

## 2025-09-21 ENCOUNTER — Encounter: Admitting: Family Medicine

## 2026-01-17 ENCOUNTER — Ambulatory Visit: Admitting: Obstetrics and Gynecology
